# Patient Record
Sex: Female | Born: 1945 | Race: White | Hispanic: No | Marital: Single | Smoking: Never smoker
Health system: Southern US, Community
[De-identification: ages and names within clinical notes are randomized; demographics above are authoritative.]

## PROBLEM LIST (undated history)

## (undated) DIAGNOSIS — F329 Major depressive disorder, single episode, unspecified: Secondary | ICD-10-CM

## (undated) DIAGNOSIS — K219 Gastro-esophageal reflux disease without esophagitis: Secondary | ICD-10-CM

## (undated) DIAGNOSIS — F419 Anxiety disorder, unspecified: Secondary | ICD-10-CM

## (undated) DIAGNOSIS — F32A Depression, unspecified: Secondary | ICD-10-CM

## (undated) DIAGNOSIS — I10 Essential (primary) hypertension: Secondary | ICD-10-CM

## (undated) DIAGNOSIS — F0390 Unspecified dementia without behavioral disturbance: Secondary | ICD-10-CM

## (undated) HISTORY — PX: EYE SURGERY: SHX253

## (undated) HISTORY — DX: Depression, unspecified: F32.A

## (undated) HISTORY — PX: OTHER SURGICAL HISTORY: SHX169

## (undated) HISTORY — PX: CHOLECYSTECTOMY: SHX55

## (undated) HISTORY — DX: Essential (primary) hypertension: I10

## (undated) HISTORY — DX: Anxiety disorder, unspecified: F41.9

## (undated) HISTORY — DX: Major depressive disorder, single episode, unspecified: F32.9

## (undated) HISTORY — DX: Gastro-esophageal reflux disease without esophagitis: K21.9

---

## 2013-06-19 DIAGNOSIS — G5603 Carpal tunnel syndrome, bilateral upper limbs: Secondary | ICD-10-CM | POA: Insufficient documentation

## 2013-06-19 DIAGNOSIS — T753XXA Motion sickness, initial encounter: Secondary | ICD-10-CM | POA: Insufficient documentation

## 2013-06-19 DIAGNOSIS — Z720 Tobacco use: Secondary | ICD-10-CM | POA: Insufficient documentation

## 2013-06-19 DIAGNOSIS — E278 Other specified disorders of adrenal gland: Secondary | ICD-10-CM | POA: Insufficient documentation

## 2013-06-19 DIAGNOSIS — J449 Chronic obstructive pulmonary disease, unspecified: Secondary | ICD-10-CM | POA: Insufficient documentation

## 2013-06-19 DIAGNOSIS — M159 Polyosteoarthritis, unspecified: Secondary | ICD-10-CM | POA: Insufficient documentation

## 2016-04-06 DIAGNOSIS — E782 Mixed hyperlipidemia: Secondary | ICD-10-CM | POA: Insufficient documentation

## 2016-05-11 DIAGNOSIS — K219 Gastro-esophageal reflux disease without esophagitis: Secondary | ICD-10-CM | POA: Insufficient documentation

## 2016-06-19 DIAGNOSIS — E042 Nontoxic multinodular goiter: Secondary | ICD-10-CM | POA: Insufficient documentation

## 2016-06-19 DIAGNOSIS — E0590 Thyrotoxicosis, unspecified without thyrotoxic crisis or storm: Secondary | ICD-10-CM | POA: Insufficient documentation

## 2016-10-04 DIAGNOSIS — G4709 Other insomnia: Secondary | ICD-10-CM | POA: Insufficient documentation

## 2016-12-08 DIAGNOSIS — E876 Hypokalemia: Secondary | ICD-10-CM | POA: Insufficient documentation

## 2017-06-18 DIAGNOSIS — F418 Other specified anxiety disorders: Secondary | ICD-10-CM | POA: Insufficient documentation

## 2017-06-18 DIAGNOSIS — R413 Other amnesia: Secondary | ICD-10-CM | POA: Insufficient documentation

## 2017-06-18 DIAGNOSIS — G479 Sleep disorder, unspecified: Secondary | ICD-10-CM | POA: Insufficient documentation

## 2017-07-12 DIAGNOSIS — F0280 Dementia in other diseases classified elsewhere without behavioral disturbance: Secondary | ICD-10-CM | POA: Insufficient documentation

## 2017-09-17 DIAGNOSIS — F411 Generalized anxiety disorder: Secondary | ICD-10-CM | POA: Insufficient documentation

## 2017-11-29 DIAGNOSIS — I10 Essential (primary) hypertension: Secondary | ICD-10-CM | POA: Insufficient documentation

## 2018-01-29 NOTE — Progress Notes (Signed)
01/30/2018 11:12 AM   Erica Walker Apr 30, 1946 [id number]  Referring provider: Sharilyn Sites, MD [address] [facility] [place], [place] [address]  Chief Complaint  Patient presents with  . Urinary Frequency    HPI: Patient is a 72 -year-old Caucasian female who is referred to [place] by Dr. Sharyne Peach for urinary incontinence with her friend, [name].    Patient states that she has had urinary incontinence for months.  She states she does not keep track of all the time she travels to the bathroom.  She states that "everything she drinks just goes right through her."     Patient has incontinence with urgency.  She is wearing 3 pads during the day and 1 to 2 pads at night. Her incontinence volume is large.    She is having associated excessive urinary frequency and nocturia x 1-3.  Patient denies any gross hematuria, dysuria or suprapubic/flank pain.  Patient denies any fevers, chills, nausea or vomiting.  Her PVR is 42 mL.     She does not have a history of urinary tract infections, STI's or injury to the bladder.   She does not have a history of nephrolithiasis, GU surgery or GU trauma.   She is not sexually active.  She is post menopausal.   She denies constipation and/or diarrhea.   She is not having pain with bladder filling.    She has not had any recent imaging studies.    She is drinking 3 to 4 tall glasses of water daily.   She is drinking two caffeinated beverages daily (Cokes).  She is not drinking alcoholic beverages daily.  An occasional ginger ale.  An occasional tea.  No coffee.    PMH: Past Medical History:  Diagnosis Date  . Acid reflux   . Anxiety   . Depression   . Hypertension     Surgical History: Gall stones  Home Medications:  Allergies as of 01/30/2018   No Known Allergies     Medication List        Accurate as of 01/30/18 11:59 PM. Always use your most recent med list.          acetaminophen 500 MG tablet Commonly  known as:  TYLENOL Take 500 mg by mouth.   aspirin EC 81 MG tablet Take by mouth.   atenolol 25 MG tablet Commonly known as:  TENORMIN TAKE ONE TABLET BY MOUTH ONCE DAILY   citalopram 10 MG tablet Commonly known as:  CELEXA Take by mouth.   donepezil 10 MG tablet Commonly known as:  ARICEPT Take by mouth.   losartan 50 MG tablet Commonly known as:  COZAAR Take by mouth.   memantine 5 MG tablet Commonly known as:  NAMENDA Take by mouth.   multivitamin with minerals tablet Take by mouth.   QUEtiapine 100 MG tablet Commonly known as:  SEROQUEL Take by mouth.   simvastatin 20 MG tablet Commonly known as:  ZOCOR Take 20 mg by mouth.       Allergies: Allergies no known allergies  Family History: No family history on file.  Social History:  reports that  has never smoked. she has never used smokeless tobacco. She reports that she does not drink alcohol or use drugs.  ROS: UROLOGY Frequent Urination?: Yes Hard to postpone urination?: No Burning/pain with urination?: No Get up at night to urinate?: Yes Leakage of urine?: Yes Urine stream starts and stops?: No Trouble starting stream?: No Do you have  to strain to urinate?: No Blood in urine?: No Urinary tract infection?: No Sexually transmitted disease?: No Injury to kidneys or bladder?: No Painful intercourse?: No Weak stream?: No Currently pregnant?: No Vaginal bleeding?: No Last menstrual period?: n  Gastrointestinal Nausea?: No Vomiting?: No Indigestion/heartburn?: No Diarrhea?: No Constipation?: No  Constitutional Fever: No Night sweats?: No Weight loss?: No Fatigue?: No  Skin Skin rash/lesions?: No Itching?: No  Eyes Blurred vision?: No Double vision?: No  Ears/Nose/Throat Sore throat?: No Sinus problems?: Yes  Hematologic/Lymphatic Swollen glands?: No Easy bruising?: No  Cardiovascular Leg swelling?: No Chest pain?: No  Respiratory Cough?: No Shortness of breath?:  No  Endocrine Excessive thirst?: No  Musculoskeletal Back pain?: No Joint pain?: Yes  Neurological Headaches?: No Dizziness?: No  Psychologic Depression?: Yes Anxiety?: Yes  Physical Exam: BP (!) 158/72   Pulse 66   Ht 5\' 4"  (1.626 m)   Wt 147 lb (66.7 kg)   BMI 25.23 kg/m   Constitutional: Well nourished. Alert and oriented, No acute distress. HEENT: [place] AT, moist mucus membranes. Trachea midline, no masses. Cardiovascular: No clubbing, cyanosis, or edema. Respiratory: Normal respiratory effort, no increased work of breathing. GI: Abdomen is soft, non tender, non distended, no abdominal masses. Liver and spleen not palpable.  No hernias appreciated.  Stool sample for occult testing is not indicated.   GU: No CVA tenderness.  No bladder fullness or masses.  Atrophic external genitalia, normal pubic hair distribution, no lesions.  Normal urethral meatus, no lesions, no prolapse, no discharge.   No urethral masses, tenderness and/or tenderness. No bladder fullness, tenderness or masses. Pale vagina mucosa, poor estrogen effect, no discharge, no lesions, poor pelvic support, Grade II cystocele. No rectocele noted.  No cervical motion tenderness.  Uterus is freely mobile and non-fixed.  No adnexal/parametria masses or tenderness noted.  Anus and perineum are without rashes or lesions.    Skin: No rashes, bruises or suspicious lesions. Lymph: No cervical or inguinal adenopathy. Neurologic: Grossly intact, no focal deficits, moving all 4 extremities. Psychiatric: Normal mood and affect.  Laboratory Data: No results found for: WBC, HGB, HCT, MCV, PLT  No results found for: CREATININE  No results found for: PSA  No results found for: TESTOSTERONE  No results found for: HGBA1C  No results found for: TSH  No results found for: CHOL, HDL, CHOLHDL, VLDL, LDLCALC  No results found for: AST No results found for: ALT No components found for: ALKALINEPHOPHATASE No components  found for: BILIRUBINTOTAL  No results found for: ESTRADIOL  Urinalysis No results found for: COLORURINE, APPEARANCEUR, LABSPEC, PHURINE, GLUCOSEU, HGBUR, BILIRUBINUR, KETONESUR, PROTEINUR, UROBILINOGEN, NITRITE, LEUKOCYTESUR  I have reviewed the labs.   Pertinent Imaging: Results for Erica Walker, Erica Walker (MRN [id number]) as of 02/12/2018 11:02  Ref. Range 01/30/2018 11:13  Scan Result Unknown 42     Assessment & Plan:    1. Urge Incontinence  - offered behavioral therapies, bladder training, bladder control strategies and pelvic floor muscle training - not a candidate due to dementia  - fluid management - good water intake   - offered medical therapy with beta-3 adrenergic receptor agonist and the potential side effects   -- would like to try the beta-3 adrenergic receptor agonist (Myrbetriq).  Given Myrbetriq 25 mg samples, #28.  I have reviewed with the patient of the side effects of Myrbetriq, such as: elevation in BP, urinary retention and/or HA.    - RTC in 3 weeks for PVR and symptom recheck   2.  Nocturia  - I explained to the patient that nocturia is often multi-factorial and difficult to treat.  Sleeping disorders, heart conditions, peripheral vascular disease, diabetes, an enlarged prostate for men, an urethral stricture causing bladder outlet obstruction and/or certain medications can contribute to nocturia.  - I have suggested that the patient avoid caffeine after noon and alcohol in the evening.  He or she may also benefit from fluid restrictions after 6:00 in the evening and voiding just prior to bedtime.  - I have explained that research studies have showed that over 84% of patients with sleep apnea reported frequent nighttime urination.   With sleep apnea, oxygen decreases, carbon dioxide increases, the blood become more acidic, the heart rate drops and blood vessels in the lung constrict.  The body is then alerted that something is very wrong. The sleeper must wake enough to  reopen the airway. By this time, the heart is racing and experiences a false signal of fluid overload. The heart excretes a hormone-like protein that tells the body to get rid of sodium and water, resulting in nocturia.  -  I also informed the patient that a recent study noted that decreasing sodium intake to 2.3 grams daily, if they don't have issues with hyponatremia, can also reduce the number of nightly voids  - There is also an increased incidence in sleep apnea with menopause, symptoms include night sweats, daytime sleepiness, depressed mood, and cognitive complaints like poor concentration or problems with short-term memory   - The patient may benefit from a discussion with his or her primary care physician to see if he or she has risk factors for sleep apnea or other sleep disturbances and obtaining a sleep study - may not be a candidate due to dementia.  Return in about 3 weeks (around 02/20/2018) for PVR and OAB questionnaire.  These notes generated with voice recognition software. I apologize for typographical errors.  Michiel Cowboy, PA-C  [facility] [address] [place], [place] [address] [phone]

## 2018-01-30 ENCOUNTER — Ambulatory Visit (INDEPENDENT_AMBULATORY_CARE_PROVIDER_SITE_OTHER): Payer: Self-pay | Admitting: Urology

## 2018-01-30 ENCOUNTER — Encounter: Payer: Self-pay | Admitting: Urology

## 2018-01-30 VITALS — BP 158/72 | HR 66 | Ht 64.0 in | Wt 147.0 lb

## 2018-01-30 DIAGNOSIS — N3941 Urge incontinence: Secondary | ICD-10-CM

## 2018-01-30 DIAGNOSIS — R351 Nocturia: Secondary | ICD-10-CM

## 2018-01-30 LAB — BLADDER SCAN AMB NON-IMAGING
SCAN RESULT: 42
Scan Result: 231

## 2018-02-12 ENCOUNTER — Encounter: Payer: Self-pay | Admitting: Urology

## 2018-02-19 NOTE — Progress Notes (Addendum)
02/20/2018 11:57 AM   Erica Walker [birth year] [id number]  Referring provider: Sharilyn Sites, MD [address] [facility] [place], [place] [address]  Chief Complaint  Patient presents with  . Urinary Incontinence    HPI: 72 yo WF with urge incontinence and nocturia who presents for a three week follow up after a trial of Myrbetriq 25 mg daily.  Background history Patient is a 64 -year-old Caucasian female who is referred to [place] by Dr. Sharyne Peach for urinary incontinence with her friend, Erica Walker.  Patient states that she has had urinary incontinence for months.  She states she does not keep track of all the time she travels to the bathroom.  She states that "everything she drinks just goes right through her."   Patient has incontinence with urgency.  She is wearing 3 pads during the day and 1 to 2 pads at night. Her incontinence volume is large.   She is having associated excessive urinary frequency and nocturia x 1-3.  Patient denies any gross hematuria, dysuria or suprapubic/flank pain.  Patient denies any fevers, chills, nausea or vomiting.  Her PVR is 42 mL.   She does not have a history of urinary tract infections, STI's or injury to the bladder.   She does not have a history of nephrolithiasis, GU surgery or GU trauma.  She is not sexually active.  She is post menopausal.  She denies constipation and/or diarrhea.  She is not having pain with bladder filling.  She has not had any recent imaging studies.  She is drinking 3 to 4 tall glasses of water daily.   She is drinking two caffeinated beverages daily (Cokes).  She is not drinking alcoholic beverages daily.  An occasional ginger ale.  An occasional tea.  No coffee.    At her visit on 01/30/2018, she was started on Myrbetriq 25 mg daily.    Today, the patient has been experiencing urgency x 0-3, frequency x 4-7, not restricting fluids to avoid visits to the restroom,  is engaging in toilet mapping, incontinence x  0-3 and nocturia x 0-3.   Her PVR is 89 mL.  Her BP is 177/75.  Patient denies any gross hematuria, dysuria or suprapubic/flank pain.  Patient denies any fevers, chills, nausea or vomiting.  Myrbetriq was effective, but the patient may have doubled up on the medication.    PMH: Past Medical History:  Diagnosis Date  . Acid reflux   . Anxiety   . Depression   . Hypertension     Surgical History: Gall stones  Home Medications:  Allergies as of 02/20/2018   No Known Allergies     Medication List        Accurate as of 02/20/18 11:57 AM. Always use your most recent med list.          acetaminophen 500 MG tablet Commonly known as:  TYLENOL Take 500 mg by mouth.   aspirin EC 81 MG tablet Take by mouth.   atenolol 25 MG tablet Commonly known as:  TENORMIN TAKE ONE TABLET BY MOUTH ONCE DAILY   citalopram 10 MG tablet Commonly known as:  CELEXA Take by mouth.   donepezil 10 MG tablet Commonly known as:  ARICEPT Take by mouth.   losartan 50 MG tablet Commonly known as:  COZAAR Take by mouth.   memantine 5 MG tablet Commonly known as:  NAMENDA Take by mouth.   mirabegron ER 25 MG Tb24 tablet Commonly known as:  MYRBETRIQ  Take 1 tablet (25 mg total) by mouth daily.   multivitamin with minerals tablet Take by mouth.   QUEtiapine 100 MG tablet Commonly known as:  SEROQUEL Take by mouth.   simvastatin 20 MG tablet Commonly known as:  ZOCOR Take 20 mg by mouth.       Allergies: No Known Allergies  Family History: No family history on file.  Social History:  reports that she has never smoked. She has never used smokeless tobacco. She reports that she does not drink alcohol or use drugs.  ROS: UROLOGY Frequent Urination?: No Hard to postpone urination?: No Burning/pain with urination?: No Get up at night to urinate?: Yes Leakage of urine?: Yes Urine stream starts and stops?: No Trouble starting stream?: No Do you have to strain to urinate?: No Blood  in urine?: No Urinary tract infection?: No Sexually transmitted disease?: No Injury to kidneys or bladder?: No Painful intercourse?: No Weak stream?: No Currently pregnant?: No Vaginal bleeding?: No Last menstrual period?: n  Gastrointestinal Nausea?: No Vomiting?: No Indigestion/heartburn?: No Diarrhea?: No Constipation?: No  Constitutional Fever: No Night sweats?: No Weight loss?: No Fatigue?: No  Skin Skin rash/lesions?: No Itching?: No  Eyes Blurred vision?: No Double vision?: No  Ears/Nose/Throat Sore throat?: No Sinus problems?: No  Hematologic/Lymphatic Swollen glands?: No Easy bruising?: No  Cardiovascular Leg swelling?: No Chest pain?: No  Respiratory Cough?: No Shortness of breath?: No  Endocrine Excessive thirst?: No  Musculoskeletal Back pain?: No Joint pain?: No  Neurological Headaches?: No Dizziness?: No  Psychologic Depression?: No Anxiety?: Yes  Physical Exam: BP (!) 162/84   Pulse (!) 47   Ht 5\' 4"  (1.626 m)   Wt 150 lb 4.8 oz (68.2 kg)   BMI 25.80 kg/m   Constitutional: Well nourished. Alert and oriented, No acute distress. HEENT:  AT, moist mucus membranes. Trachea midline, no masses. Cardiovascular: No clubbing, cyanosis, or edema. Respiratory: Normal respiratory effort, no increased work of breathing. Skin: No rashes, bruises or suspicious lesions. Lymph: No cervical or inguinal adenopathy. Neurologic: Grossly intact, no focal deficits, moving all 4 extremities. Psychiatric: Normal mood and affect.    Laboratory Data: No results found for: WBC, HGB, HCT, MCV, PLT  No results found for: CREATININE  No results found for: PSA  No results found for: TESTOSTERONE  No results found for: HGBA1C  No results found for: TSH  No results found for: CHOL, HDL, CHOLHDL, VLDL, LDLCALC  No results found for: AST No results found for: ALT No components found for: ALKALINEPHOPHATASE No components found for:  BILIRUBINTOTAL  No results found for: ESTRADIOL  Urinalysis No results found for: COLORURINE, APPEARANCEUR, LABSPEC, PHURINE, GLUCOSEU, HGBUR, BILIRUBINUR, KETONESUR, PROTEINUR, UROBILINOGEN, NITRITE, LEUKOCYTESUR  I have reviewed the labs.   Pertinent Imaging: Results for Docia ChuckSNIPES, Madalina RILEY (MRN [id number]) as of 02/20/2018 11:55  Ref. Range 02/20/2018 11:00  Scan Result Unknown 89    Assessment & Plan:    1. Urge Incontinence  - offered behavioral therapies, bladder training, bladder control strategies and pelvic floor muscle training - not a candidate due to dementia  - fluid management - good water intake   - offered medical therapy with beta-3 adrenergic receptor agonist and the potential side effects   --continue the Myrbetriq 25 mg; script is given   - RTC in 3 months for PVR and symptom recheck   2. Nocturia  - improved with Myrbetriq  3. HTN Continue to monitor the blood pressure at home if remains elevated contact PCP  Red flags are reviewed     Return in about 3 months (around 05/23/2018) for PVR and OAB questionnaire.  These notes generated with voice recognition software. I apologize for typographical errors.  Michiel Cowboy, PA-C  [facility] [address] [place], [place] [address] [phone]

## 2018-02-20 ENCOUNTER — Encounter: Payer: Self-pay | Admitting: Urology

## 2018-02-20 ENCOUNTER — Other Ambulatory Visit: Payer: Self-pay

## 2018-02-20 ENCOUNTER — Ambulatory Visit (INDEPENDENT_AMBULATORY_CARE_PROVIDER_SITE_OTHER): Payer: Self-pay | Admitting: Urology

## 2018-02-20 VITALS — BP 162/84 | HR 47 | Ht 64.0 in | Wt 150.3 lb

## 2018-02-20 DIAGNOSIS — N3941 Urge incontinence: Secondary | ICD-10-CM

## 2018-02-20 DIAGNOSIS — R351 Nocturia: Secondary | ICD-10-CM

## 2018-02-20 LAB — BLADDER SCAN AMB NON-IMAGING: SCAN RESULT: 89

## 2018-02-20 MED ORDER — MIRABEGRON ER 25 MG PO TB24: 25.0000 mg | ORAL_TABLET | Freq: Every day | ORAL | 3 refills | Status: DC

## 2018-02-20 NOTE — Addendum Note (Signed)
Addended by: Michiel CowboyMCGOWAN, Katianna Mcclenney A on: 02/20/2018 12:03 PM   Modules accepted: Level of Service

## 2018-02-20 NOTE — Patient Instructions (Signed)
Mirabegron extended-release tablets What is this medicine? MIRABEGRON (MIR a BEG ron) is used to treat overactive bladder. This medicine reduces the amount of bathroom visits. It may also help to control wetting accidents. This medicine may be used for other purposes; ask your health care provider or pharmacist if you have questions. COMMON BRAND NAME(S): Myrbetriq What should I tell my health care provider before I take this medicine? They need to know if you have any of these conditions: -difficulty passing urine -high blood pressure -kidney disease -liver disease -an unusual or allergic reaction to mirabegron, other medicines, foods, dyes, or preservatives -pregnant or trying to get pregnant -breast-feeding How should I use this medicine? Take this medicine by mouth with a glass of water. Follow the directions on the prescription label. Do not cut, crush or chew this medicine. You can take it with or without food. If it upsets your stomach, take it with food. Take your medicine at regular intervals. Do not take it more often than directed. Do not stop taking except on your doctor's advice. Talk to your pediatrician regarding the use of this medicine in children. Special care may be needed. Overdosage: If you think you have taken too much of this medicine contact a poison control center or emergency room at once. NOTE: This medicine is only for you. Do not share this medicine with others. What if I miss a dose? If you miss a dose, take it as soon as you can. If it is almost time for your next dose, take only that dose. Do not take double or extra doses. What may interact with this medicine? -certain medicines for bladder problems like fesoterodine, oxybutynin, solifenacin, tolterodine -desipramine -digoxin -flecainide -ketoconazole -MAOIs like Carbex, Eldepryl, Marplan, Nardil, and Parnate -metoprolol -propafenone -thioridazine -warfarin This list may not describe all possible  interactions. Give your health care provider a list of all the medicines, herbs, non-prescription drugs, or dietary supplements you use. Also tell them if you smoke, drink alcohol, or use illegal drugs. Some items may interact with your medicine. What should I watch for while using this medicine? It may take 8 weeks to notice the full benefit from this medicine. You may need to limit your intake tea, coffee, caffeinated sodas, and alcohol. These drinks may make your symptoms worse. Visit your doctor or health care professional for regular checks on your progress. Check your blood pressure as directed. Ask your doctor or health care professional what your blood pressure should be and when you should contact him or her. What side effects may I notice from receiving this medicine? Side effects that you should report to your doctor or health care professional as soon as possible: -allergic reactions like skin rash, itching or hives, swelling of the face, lips, or tongue -chest pain or palpitations -severe or sudden headache -high blood pressure -fast, irregular heartbeat -redness, blistering, peeling or loosening of the skin, including inside the mouth -signs of infection like fever or chills; cough; sore throat; pain or difficulty passing urine -trouble passing urine or change in the amount of urine Side effects that usually do not require medical attention (report to your doctor or health care professional if they continue or are bothersome): -constipation -diarrhea -dizziness -dry eyes -joint pain -mild headache -nausea -runny nose This list may not describe all possible side effects. Call your doctor for medical advice about side effects. You may report side effects to FDA at 1-800-FDA-1088. Where should I keep my medicine? Keep out of the reach   of children. Store at room temperature between 15 and 30 degrees C (59 and 86 degrees F). Throw away any unused medicine after the expiration  date. NOTE: This sheet is a summary. It may not cover all possible information. If you have questions about this medicine, talk to your doctor, pharmacist, or health care provider.  2018 Elsevier/Gold Standard (2015-12-16 12:14:30)  

## 2018-04-15 DIAGNOSIS — B029 Zoster without complications: Secondary | ICD-10-CM | POA: Insufficient documentation

## 2018-05-17 ENCOUNTER — Other Ambulatory Visit: Payer: Self-pay | Admitting: Family Medicine

## 2018-05-17 DIAGNOSIS — Z1239 Encounter for other screening for malignant neoplasm of breast: Secondary | ICD-10-CM

## 2018-05-18 NOTE — Progress Notes (Signed)
05/20/2018 12:10 PM   Erica Walker Apr 03, 1946 [id number]  Referring provider: Sharilyn Sites, MD [address] [facility] [place], [place] [address]  Chief Complaint  Patient presents with  . Urinary Incontinence    HPI: 72 yo WF with urge incontinence and nocturia who presents for a three month follow up after a trial of Myrbetriq 25 mg daily.  Background history Patient is a 65 -year-old Caucasian female who is referred to [place] by Dr. Sharyne Peach for urinary incontinence with her friend, [name].  Patient states that she has had urinary incontinence for months.  She states she does not keep track of all the time she travels to the bathroom.  She states that "everything she drinks just goes right through her."   Patient has incontinence with urgency.  She is wearing 3 pads during the day and 1 to 2 pads at night. Her incontinence volume is large.   She is having associated excessive urinary frequency and nocturia x 1-3.  Patient denies any gross hematuria, dysuria or suprapubic/flank pain.  Patient denies any fevers, chills, nausea or vomiting.  Her PVR is 42 mL.   She does not have a history of urinary tract infections, STI's or injury to the bladder.   She does not have a history of nephrolithiasis, GU surgery or GU trauma.  She is not sexually active.  She is post menopausal.  She denies constipation and/or diarrhea.  She is not having pain with bladder filling.  She has not had any recent imaging studies.  She is drinking 3 to 4 tall glasses of water daily.   She is drinking two caffeinated beverages daily (Cokes).  She is not drinking alcoholic beverages daily.  An occasional ginger ale.  An occasional tea.  No coffee.    At her visit on 01/30/2018, she was started on Myrbetriq 25 mg daily.    Today, the patient has been experiencing urgency x 0-3 (stable), frequency x 4-7 (stable), not restricting fluids to avoid visits to the restroom,  is engaging in toilet  mapping, incontinence x 0-3 (stable) and nocturia x 0-3 (stable).   Her PVR is 119 mL.  Her BP is 125/70.  Patient denies any gross hematuria, dysuria or suprapubic/flank pain.  Patient denies any fevers, chills, nausea or vomiting.    She is still satisfied with the Myrbetriq 25 mg daily and would like to continue that medication.  PMH: Past Medical History:  Diagnosis Date  . Acid reflux   . Anxiety   . Depression   . Hypertension     Surgical History: Gall stones  Home Medications:  Allergies as of 05/20/2018      Reactions   Other Rash   Allergy to Bel-tab      Medication List        Accurate as of 05/20/18 12:10 PM. Always use your most recent med list.          acetaminophen 500 MG tablet Commonly known as:  TYLENOL Take 500 mg by mouth.   aspirin EC 81 MG tablet Take by mouth.   atenolol 25 MG tablet Commonly known as:  TENORMIN TAKE ONE TABLET BY MOUTH ONCE DAILY   citalopram 10 MG tablet Commonly known as:  CELEXA Take by mouth.   donepezil 10 MG tablet Commonly known as:  ARICEPT Take by mouth.   DRISTAN SPRAY 0.05 % nasal spray Generic drug:  oxymetazoline 1 spray by Each Nare route daily as needed.  fluticasone 50 MCG/ACT nasal spray Commonly known as:  FLONASE Two sprays in each nostril once daily.   losartan 50 MG tablet Commonly known as:  COZAAR Take by mouth.   memantine 5 MG tablet Commonly known as:  NAMENDA Take by mouth.   mirabegron ER 25 MG Tb24 tablet Commonly known as:  MYRBETRIQ Take 1 tablet (25 mg total) by mouth daily.   multivitamin with minerals tablet Take by mouth.   QUEtiapine 100 MG tablet Commonly known as:  SEROQUEL Take by mouth.   simvastatin 20 MG tablet Commonly known as:  ZOCOR Take 20 mg by mouth.   valACYclovir 1000 MG tablet Commonly known as:  VALTREX       Allergies:  Allergies  Allergen Reactions  . Other Rash    Allergy to Bel-tab    Family History: History reviewed. No  pertinent family history.  Social History:  reports that she has never smoked. She has never used smokeless tobacco. She reports that she does not drink alcohol or use drugs.  ROS: UROLOGY Frequent Urination?: Yes Hard to postpone urination?: No Burning/pain with urination?: No Get up at night to urinate?: Yes Leakage of urine?: Yes Urine stream starts and stops?: No Trouble starting stream?: No Do you have to strain to urinate?: No Blood in urine?: No Urinary tract infection?: No Sexually transmitted disease?: No Injury to kidneys or bladder?: No Painful intercourse?: No Weak stream?: No Currently pregnant?: No Vaginal bleeding?: No Last menstrual period?: Postmenopausal  Gastrointestinal Nausea?: No Vomiting?: No Indigestion/heartburn?: No Diarrhea?: No Constipation?: No  Constitutional Fever: No Night sweats?: No Weight loss?: No Fatigue?: No  Skin Skin rash/lesions?: No Itching?: No  Eyes Blurred vision?: No Double vision?: No  Ears/Nose/Throat Sore throat?: No Sinus problems?: No  Hematologic/Lymphatic Easy bruising?: No  Cardiovascular Leg swelling?: No Chest pain?: No  Respiratory Cough?: No Shortness of breath?: No  Endocrine Excessive thirst?: No  Musculoskeletal Back pain?: No Joint pain?: No  Neurological Headaches?: No Dizziness?: No  Psychologic Depression?: Yes Anxiety?: Yes  Physical Exam: BP 125/70 (BP Location: Left Arm, Patient Position: Sitting, Cuff Size: Large)   Pulse 77   Ht 5\' 4"  (1.626 m)   Wt 148 lb 11.2 oz (67.4 kg)   BMI 25.52 kg/m   Constitutional: Well nourished. Alert and oriented, No acute distress. HEENT: [place] AT, moist mucus membranes. Trachea midline, no masses. Cardiovascular: No clubbing, cyanosis, or edema. Respiratory: Normal respiratory effort, no increased work of breathing. Skin: No rashes, bruises or suspicious lesions. Lymph: No cervical or inguinal adenopathy. Neurologic: Grossly  intact, no focal deficits, moving all 4 extremities. Psychiatric: Normal mood and affect.   Laboratory Data: No results found for: WBC, HGB, HCT, MCV, PLT  No results found for: CREATININE  No results found for: PSA  No results found for: TESTOSTERONE  No results found for: HGBA1C  No results found for: TSH  No results found for: CHOL, HDL, CHOLHDL, VLDL, LDLCALC  No results found for: AST No results found for: ALT No components found for: ALKALINEPHOPHATASE No components found for: BILIRUBINTOTAL  No results found for: ESTRADIOL  Urinalysis No results found for: COLORURINE, APPEARANCEUR, LABSPEC, PHURINE, GLUCOSEU, HGBUR, BILIRUBINUR, KETONESUR, PROTEINUR, UROBILINOGEN, NITRITE, LEUKOCYTESUR  I have reviewed the labs.   Pertinent Imaging: Results for Sudie BaileySNIPES, Shiva K RILEY (MRN [id number]) as of 05/20/2018 20:44  Ref. Range 05/20/2018 11:41  Scan Result Unknown 119mL     Assessment & Plan:    1. Urge Incontinence At goal with  Myrbetriq 25 mg daily Patient return in 1 year for OAB questionnaire and PVR  2. Nocturia Improved with Myrbetriq     Return in about 1 year (around 05/21/2019) for PVR and OAB questionnaire.  These notes generated with voice recognition software. I apologize for typographical errors.  Michiel Cowboy, PA-C  [facility] [address] [place], [place] [address] [phone]

## 2018-05-20 ENCOUNTER — Encounter: Payer: Self-pay | Admitting: Urology

## 2018-05-20 ENCOUNTER — Ambulatory Visit (INDEPENDENT_AMBULATORY_CARE_PROVIDER_SITE_OTHER): Payer: Medicare Other | Admitting: Urology

## 2018-05-20 VITALS — BP 125/70 | HR 77 | Ht 64.0 in | Wt 148.7 lb

## 2018-05-20 DIAGNOSIS — N3941 Urge incontinence: Secondary | ICD-10-CM | POA: Diagnosis not present

## 2018-05-20 DIAGNOSIS — R351 Nocturia: Secondary | ICD-10-CM

## 2018-05-20 LAB — BLADDER SCAN AMB NON-IMAGING

## 2019-01-15 ENCOUNTER — Other Ambulatory Visit: Payer: Self-pay | Admitting: Urology

## 2019-05-20 ENCOUNTER — Telehealth (INDEPENDENT_AMBULATORY_CARE_PROVIDER_SITE_OTHER): Payer: Medicare Other | Admitting: Urology

## 2019-05-20 ENCOUNTER — Other Ambulatory Visit: Payer: Self-pay

## 2019-05-20 DIAGNOSIS — N3941 Urge incontinence: Secondary | ICD-10-CM | POA: Diagnosis not present

## 2019-05-20 NOTE — Progress Notes (Signed)
Virtual Visit via Telephone Note  I connected with Erica Walker on 05/20/2019 at 1112 by telephone and verified that I am speaking with the correct person using two identifiers.  They are located at home.  I am located at my home.    This visit type was conducted due to national recommendations for restrictions regarding the COVID-19 Pandemic (e.g. social distancing).  This format is felt to be most appropriate for this patient at this time.  All issues noted in this document were discussed and addressed.  No physical exam was performed.   I discussed the limitations, risks, security and privacy concerns of performing an evaluation and management service by telephone and the availability of in person appointments. I also discussed with the patient that there may be a patient responsible charge related to this service. The patient expressed understanding and agreed to proceed.   History of Present Illness: Erica Walker is a 73 year old female with a history of urge incontinence who is contacted today for a yearly follow up via telephone.  She states she is no longer taking the Myrbetriq and she is no longer having issues with incontinence.  She cannot give a time for when she discontinued the Myrbetriq or for what reason.  She also cannot tell me when her incontinence resolved.    She was seen by her PCP recently and in his notes, her friend with her at the visit stated she started having "sweats" when she started the Myrbetriq.  Erica Walker did not recall this discussion.    Patient denies any gross hematuria, dysuria or suprapubic/flank pain.  Patient denies any fevers, chills, nausea or vomiting.    Observations/Objective: Erica Walker does not sound distressed, but she is forgetful about her visit with her PCP.    Assessment and Plan:  1. Urge incontinence Patient is no longer having urinary issues at this time.    Follow Up Instructions:  Erica Walker will follow up on a prn  basis.   I discussed the assessment and treatment plan with the patient. The patient was provided an opportunity to ask questions and all were answered. The patient agreed with the plan and demonstrated an understanding of the instructions.   The patient was advised to call back or seek an in-person evaluation if the symptoms worsen or if the condition fails to improve as anticipated.  I provided 6 minutes of non-face-to-face time during this encounter.   Erica Spainhower, PA-C

## 2019-09-24 ENCOUNTER — Other Ambulatory Visit: Payer: Self-pay

## 2019-09-24 ENCOUNTER — Inpatient Hospital Stay
Admission: EM | Admit: 2019-09-24 | Discharge: 2019-09-29 | DRG: 522 | Disposition: A | Discharge status: Skilled Nursing Facility | Payer: Medicare Other | Attending: Internal Medicine | Admitting: Internal Medicine

## 2019-09-24 ENCOUNTER — Emergency Department: Payer: Medicare Other

## 2019-09-24 DIAGNOSIS — S79912A Unspecified injury of left hip, initial encounter: Secondary | ICD-10-CM | POA: Diagnosis present

## 2019-09-24 DIAGNOSIS — Z20828 Contact with and (suspected) exposure to other viral communicable diseases: Secondary | ICD-10-CM | POA: Diagnosis present

## 2019-09-24 DIAGNOSIS — F418 Other specified anxiety disorders: Secondary | ICD-10-CM | POA: Diagnosis present

## 2019-09-24 DIAGNOSIS — E785 Hyperlipidemia, unspecified: Secondary | ICD-10-CM | POA: Diagnosis present

## 2019-09-24 DIAGNOSIS — Z79899 Other long term (current) drug therapy: Secondary | ICD-10-CM

## 2019-09-24 DIAGNOSIS — W010XXA Fall on same level from slipping, tripping and stumbling without subsequent striking against object, initial encounter: Secondary | ICD-10-CM | POA: Diagnosis present

## 2019-09-24 DIAGNOSIS — Z8249 Family history of ischemic heart disease and other diseases of the circulatory system: Secondary | ICD-10-CM

## 2019-09-24 DIAGNOSIS — F05 Delirium due to known physiological condition: Secondary | ICD-10-CM | POA: Diagnosis not present

## 2019-09-24 DIAGNOSIS — E876 Hypokalemia: Secondary | ICD-10-CM | POA: Diagnosis present

## 2019-09-24 DIAGNOSIS — Z888 Allergy status to other drugs, medicaments and biological substances status: Secondary | ICD-10-CM

## 2019-09-24 DIAGNOSIS — L89152 Pressure ulcer of sacral region, stage 2: Secondary | ICD-10-CM | POA: Diagnosis present

## 2019-09-24 DIAGNOSIS — S72009A Fracture of unspecified part of neck of unspecified femur, initial encounter for closed fracture: Secondary | ICD-10-CM | POA: Diagnosis present

## 2019-09-24 DIAGNOSIS — I10 Essential (primary) hypertension: Secondary | ICD-10-CM | POA: Diagnosis present

## 2019-09-24 DIAGNOSIS — F0390 Unspecified dementia without behavioral disturbance: Secondary | ICD-10-CM | POA: Diagnosis present

## 2019-09-24 DIAGNOSIS — J449 Chronic obstructive pulmonary disease, unspecified: Secondary | ICD-10-CM | POA: Diagnosis present

## 2019-09-24 DIAGNOSIS — K219 Gastro-esophageal reflux disease without esophagitis: Secondary | ICD-10-CM | POA: Diagnosis present

## 2019-09-24 DIAGNOSIS — W19XXXA Unspecified fall, initial encounter: Secondary | ICD-10-CM | POA: Diagnosis not present

## 2019-09-24 DIAGNOSIS — Z7982 Long term (current) use of aspirin: Secondary | ICD-10-CM

## 2019-09-24 DIAGNOSIS — E782 Mixed hyperlipidemia: Secondary | ICD-10-CM | POA: Diagnosis present

## 2019-09-24 DIAGNOSIS — S72002A Fracture of unspecified part of neck of left femur, initial encounter for closed fracture: Secondary | ICD-10-CM | POA: Diagnosis not present

## 2019-09-24 DIAGNOSIS — Y92019 Unspecified place in single-family (private) house as the place of occurrence of the external cause: Secondary | ICD-10-CM

## 2019-09-24 DIAGNOSIS — S72012A Unspecified intracapsular fracture of left femur, initial encounter for closed fracture: Secondary | ICD-10-CM | POA: Diagnosis present

## 2019-09-24 DIAGNOSIS — L8990 Pressure ulcer of unspecified site, unspecified stage: Secondary | ICD-10-CM | POA: Insufficient documentation

## 2019-09-24 DIAGNOSIS — Z419 Encounter for procedure for purposes other than remedying health state, unspecified: Secondary | ICD-10-CM

## 2019-09-24 HISTORY — DX: Unspecified dementia, unspecified severity, without behavioral disturbance, psychotic disturbance, mood disturbance, and anxiety: F03.90

## 2019-09-24 LAB — CBC WITH DIFFERENTIAL/PLATELET
Abs Immature Granulocytes: 0.04 10*3/uL (ref 0.00–0.07)
Basophils Absolute: 0 10*3/uL (ref 0.0–0.1)
Basophils Relative: 0 %
Eosinophils Absolute: 0 10*3/uL (ref 0.0–0.5)
Eosinophils Relative: 1 %
HCT: 40.4 % (ref 36.0–46.0)
Hemoglobin: 13.5 g/dL (ref 12.0–15.0)
Immature Granulocytes: 1 %
Lymphocytes Relative: 9 %
Lymphs Abs: 0.8 10*3/uL (ref 0.7–4.0)
MCH: 30.2 pg (ref 26.0–34.0)
MCHC: 33.4 g/dL (ref 30.0–36.0)
MCV: 90.4 fL (ref 80.0–100.0)
Monocytes Absolute: 0.6 10*3/uL (ref 0.1–1.0)
Monocytes Relative: 7 %
Neutro Abs: 6.9 10*3/uL (ref 1.7–7.7)
Neutrophils Relative %: 82 %
Platelets: 154 10*3/uL (ref 150–400)
RBC: 4.47 MIL/uL (ref 3.87–5.11)
RDW: 12.2 % (ref 11.5–15.5)
WBC: 8.4 10*3/uL (ref 4.0–10.5)
nRBC: 0 % (ref 0.0–0.2)

## 2019-09-24 LAB — TROPONIN I (HIGH SENSITIVITY)
Troponin I (High Sensitivity): 13 ng/L (ref <18)
Troponin I (High Sensitivity): 23 ng/L — ABNORMAL HIGH (ref <18)

## 2019-09-24 LAB — COMPREHENSIVE METABOLIC PANEL
ALT: 21 U/L (ref 0–44)
AST: 26 U/L (ref 15–41)
Albumin: 3.6 g/dL (ref 3.5–5.0)
Alkaline Phosphatase: 74 U/L (ref 38–126)
Anion gap: 12 (ref 5–15)
BUN: 8 mg/dL (ref 8–23)
CO2: 26 mmol/L (ref 22–32)
Calcium: 8.6 mg/dL — ABNORMAL LOW (ref 8.9–10.3)
Chloride: 101 mmol/L (ref 98–111)
Creatinine, Ser: 0.84 mg/dL (ref 0.44–1.00)
GFR calc Af Amer: >60 mL/min (ref >60)
GFR calc non Af Amer: >60 mL/min (ref >60)
Glucose, Bld: 113 mg/dL — ABNORMAL HIGH (ref 70–99)
Potassium: 2.8 mmol/L — ABNORMAL LOW (ref 3.5–5.1)
Sodium: 139 mmol/L (ref 135–145)
Total Bilirubin: 1.8 mg/dL — ABNORMAL HIGH (ref 0.3–1.2)
Total Protein: 6.4 g/dL — ABNORMAL LOW (ref 6.5–8.1)

## 2019-09-24 LAB — SAMPLE TO BLOOD BANK

## 2019-09-24 LAB — SARS CORONAVIRUS 2 BY RT PCR (HOSPITAL ORDER, PERFORMED IN ~~LOC~~ HOSPITAL LAB): SARS Coronavirus 2: NEGATIVE

## 2019-09-24 LAB — MAGNESIUM: Magnesium: 1.8 mg/dL (ref 1.7–2.4)

## 2019-09-24 MED ORDER — ACETAMINOPHEN 325 MG PO TABS
650.0000 mg | ORAL_TABLET | Freq: Four times a day (QID) | ORAL | Status: DC | PRN
  Administered 2019-09-24 – 2019-09-29 (×3): 650 mg via ORAL
  Filled 2019-09-24 (×3): qty 2

## 2019-09-24 MED ORDER — ADULT MULTIVITAMIN W/MINERALS CH
1.0000 | ORAL_TABLET | Freq: Every day | ORAL | Status: DC
  Administered 2019-09-26 – 2019-09-29 (×4): 1 via ORAL
  Filled 2019-09-24 (×4): qty 1

## 2019-09-24 MED ORDER — CEFAZOLIN SODIUM-DEXTROSE 2-4 GM/100ML-% IV SOLN
2.0000 g | INTRAVENOUS | Status: AC
  Administered 2019-09-25: 2 g via INTRAVENOUS
  Filled 2019-09-24 (×2): qty 100

## 2019-09-24 MED ORDER — ATENOLOL 25 MG PO TABS: 25.0000 mg | ORAL_TABLET | Freq: Every day | ORAL | Status: DC

## 2019-09-24 MED ORDER — ONDANSETRON HCL 4 MG PO TABS
4.0000 mg | ORAL_TABLET | Freq: Four times a day (QID) | ORAL | Status: DC | PRN
  Filled 2019-09-24: qty 1

## 2019-09-24 MED ORDER — MORPHINE SULFATE (PF) 2 MG/ML IV SOLN
1.0000 mg | INTRAVENOUS | Status: DC | PRN
  Administered 2019-09-25 (×2): 1 mg via INTRAVENOUS
  Filled 2019-09-24 (×2): qty 1

## 2019-09-24 MED ORDER — LOSARTAN POTASSIUM 50 MG PO TABS
50.0000 mg | ORAL_TABLET | Freq: Every day | ORAL | Status: DC
  Administered 2019-09-26 – 2019-09-29 (×4): 50 mg via ORAL
  Filled 2019-09-24 (×4): qty 1

## 2019-09-24 MED ORDER — POTASSIUM CHLORIDE CRYS ER 20 MEQ PO TBCR
40.0000 meq | EXTENDED_RELEASE_TABLET | Freq: Two times a day (BID) | ORAL | Status: AC
  Administered 2019-09-24 – 2019-09-25 (×2): 40 meq via ORAL
  Filled 2019-09-24 (×2): qty 2

## 2019-09-24 MED ORDER — OXYMETAZOLINE HCL 0.05 % NA SOLN
1.0000 | Freq: Two times a day (BID) | NASAL | Status: DC
  Administered 2019-09-24: 1 via NASAL
  Filled 2019-09-24: qty 15

## 2019-09-24 MED ORDER — FENTANYL CITRATE (PF) 100 MCG/2ML IJ SOLN
25.0000 ug | Freq: Once | INTRAMUSCULAR | Status: AC
  Administered 2019-09-24: 14:00:00 25 ug via INTRAVENOUS
  Filled 2019-09-24: qty 2

## 2019-09-24 MED ORDER — OXYCODONE HCL 5 MG PO TABS
5.0000 mg | ORAL_TABLET | ORAL | Status: DC | PRN
  Administered 2019-09-25 – 2019-09-26 (×3): 5 mg via ORAL
  Filled 2019-09-24 (×3): qty 1

## 2019-09-24 MED ORDER — FLUTICASONE PROPIONATE 50 MCG/ACT NA SUSP
1.0000 | Freq: Every day | NASAL | Status: DC
  Administered 2019-09-29: 1 via NASAL
  Filled 2019-09-24 (×2): qty 16

## 2019-09-24 MED ORDER — SODIUM CHLORIDE 0.9 % IV SOLN
INTRAVENOUS | Status: DC
  Administered 2019-09-24: 18:00:00 via INTRAVENOUS

## 2019-09-24 MED ORDER — MAGNESIUM SULFATE IN D5W 1-5 GM/100ML-% IV SOLN
1.0000 g | Freq: Once | INTRAVENOUS | Status: AC
  Administered 2019-09-24: 22:00:00 1 g via INTRAVENOUS
  Filled 2019-09-24: qty 100

## 2019-09-24 MED ORDER — SIMVASTATIN 20 MG PO TABS
20.0000 mg | ORAL_TABLET | Freq: Every day | ORAL | Status: DC
  Administered 2019-09-24 – 2019-09-28 (×4): 20 mg via ORAL
  Filled 2019-09-24 (×4): qty 1

## 2019-09-24 MED ORDER — ACETAMINOPHEN 650 MG RE SUPP: 650.0000 mg | Freq: Four times a day (QID) | RECTAL | Status: DC | PRN

## 2019-09-24 MED ORDER — SODIUM CHLORIDE 0.9 % IV SOLN: Freq: Once | INTRAVENOUS | Status: DC

## 2019-09-24 MED ORDER — DONEPEZIL HCL 5 MG PO TABS
10.0000 mg | ORAL_TABLET | Freq: Every day | ORAL | Status: DC
  Administered 2019-09-24 – 2019-09-28 (×5): 10 mg via ORAL
  Filled 2019-09-24 (×5): qty 2

## 2019-09-24 MED ORDER — POTASSIUM CHLORIDE CRYS ER 20 MEQ PO TBCR
40.0000 meq | EXTENDED_RELEASE_TABLET | Freq: Once | ORAL | Status: AC
  Administered 2019-09-24: 15:00:00 40 meq via ORAL
  Filled 2019-09-24: qty 2

## 2019-09-24 MED ORDER — POTASSIUM CHLORIDE 10 MEQ/100ML IV SOLN
10.0000 meq | INTRAVENOUS | Status: AC
  Administered 2019-09-24 (×2): 10 meq via INTRAVENOUS
  Filled 2019-09-24 (×2): qty 100

## 2019-09-24 MED ORDER — BUPROPION HCL ER (XL) 150 MG PO TB24
150.0000 mg | ORAL_TABLET | Freq: Every day | ORAL | Status: DC
  Administered 2019-09-26 – 2019-09-29 (×4): 150 mg via ORAL
  Filled 2019-09-24 (×4): qty 1

## 2019-09-24 MED ORDER — ONDANSETRON HCL 4 MG/2ML IJ SOLN
4.0000 mg | Freq: Four times a day (QID) | INTRAMUSCULAR | Status: DC | PRN
  Filled 2019-09-24: qty 2

## 2019-09-24 MED ORDER — MEMANTINE HCL 5 MG PO TABS
10.0000 mg | ORAL_TABLET | Freq: Two times a day (BID) | ORAL | Status: DC
  Administered 2019-09-24 – 2019-09-29 (×9): 10 mg via ORAL
  Filled 2019-09-24 (×9): qty 2

## 2019-09-24 MED ORDER — TRANEXAMIC ACID-NACL 1000-0.7 MG/100ML-% IV SOLN
1000.0000 mg | INTRAVENOUS | Status: AC
  Administered 2019-09-25: 1000 mg via INTRAVENOUS
  Filled 2019-09-24: qty 100

## 2019-09-24 MED ORDER — QUETIAPINE FUMARATE 25 MG PO TABS
50.0000 mg | ORAL_TABLET | Freq: Every day | ORAL | Status: DC
  Administered 2019-09-24: 21:00:00 50 mg via ORAL
  Filled 2019-09-24: qty 2

## 2019-09-24 NOTE — ED Notes (Signed)
ED TO INPATIENT HANDOFF REPORT  ED Nurse Name and Phone #: [phone]  S Name/Age/Gender Erica Walker 73 y.o. female Room/Bed: ED11A/ED11A  Code Status   Code Status: Not on file  Home/SNF/Other Home A/Ox4 Is this baseline? Yes   Triage Complete: Triage complete  Chief Complaint fall  [facility]. [facility] right [facility]/femur area and having trouble walking today.  She says she did not feel bad prior to fall--says she just went down.  Did not trip.  Mechanical fell yesterday, pain to left hip and leg since. Able to walk but with pain. Pt alert and oriented X4, cooperative, RR even and unlabored, color WNL. Pt in NAD.    Allergies Allergies  Allergen Reactions  . Other Rash    Allergy to Bel-tab    Level of Care/Admitting Diagnosis ED Disposition    ED Disposition Condition Comment   Admit  The patient appears reasonably stabilized for admission considering the current resources, flow, and capabilities available in the ED at this time, and I doubt any other [facility] requiring further screening and/or treatment in the ED prior to admission is  present.       B Medical/Surgery History Past Medical History:  Diagnosis Date  . Acid reflux   . Anxiety   . Depression   . Hypertension    Past Surgical History:  Procedure Laterality Date  . CHOLECYSTECTOMY    . EYE SURGERY    . gallstones       A IV Location/Drains/Wounds Patient Lines/Drains/Airways Status   Active Line/Drains/Airways    Name:   Placement date:   Placement time:   Site:   Days:   Peripheral IV 09/24/19 Left Hand   09/24/19    1302    Hand   less than 1   External Urinary Catheter   09/24/19    1442    -   less than 1          Intake/Output Last 24 hours No intake or output data in the 24 hours ending 09/24/19 1525  Labs/Imaging Results for orders placed or performed during the hospital encounter of 09/24/19 (from the past 48 hour(s))  CBC with Differential     Status: None   Collection Time: 09/24/19 12:56 PM  Result Value Ref Range   WBC 8.4 4.0 - 10.5 K/uL   RBC 4.47 3.87 - 5.11 MIL/uL   Hemoglobin 13.5 12.0 - 15.0 g/dL   HCT 16.140.4 09.636.0 - 04.546.0 %   MCV 90.4 80.0 - 100.0 fL   MCH 30.2 26.0 - 34.0 pg   MCHC 33.4 30.0 - 36.0 g/dL   RDW 40.912.2 81.111.5 - 91.415.5 %   Platelets 154 150 - 400 K/uL   nRBC 0.0 0.0 - 0.2 %   Neutrophils Relative % 82 %   Neutro Abs 6.9 1.7 - 7.7 K/uL   Lymphocytes Relative 9 %   Lymphs Abs 0.8 0.7 - 4.0 K/uL   Monocytes Relative 7 %   Monocytes Absolute 0.6 0.1 - 1.0 K/uL   Eosinophils Relative 1 %   Eosinophils Absolute 0.0 0.0 - 0.5 K/uL   Basophils Relative 0 %   Basophils Absolute 0.0 0.0 - 0.1 K/uL   Immature Granulocytes 1 %   Abs Immature Granulocytes 0.04 0.00 - 0.07 K/uL    Comment: Performed at [facility], [address] [place], [place] [address]  Comprehensive metabolic panel     Status: Abnormal   Collection Time: 09/24/19 12:56 PM  Result Value Ref Range   Sodium 139 135 - 145 mmol/L   Potassium 2.8 (L) 3.5 - 5.1 mmol/L   Chloride 101 98 - 111 mmol/L   CO2 26 22 - 32 mmol/L   Glucose, Bld 113 (H) 70 - 99 mg/dL   BUN 8 8 - 23 mg/dL   Creatinine, Ser 0.84 0.44 - 1.00 mg/dL   Calcium 8.6 (L) 8.9 - 10.3 mg/dL   Total Protein 6.4 (L) 6.5 - 8.1 g/dL   Albumin 3.6 3.5 - 5.0 g/dL   AST 26 15 - 41 U/L   ALT 21 0 - 44 U/L   Alkaline Phosphatase 74 38 - 126 U/L   Total Bilirubin 1.8 (H) 0.3 - 1.2 mg/dL   GFR calc non Af Amer >60 >60 mL/min   GFR calc Af Amer >60 >60 mL/min   Anion gap 12 5 - 15    Comment: Performed at [facility], [place], [place] [address]  Troponin I (High Sensitivity)     Status: None   Collection Time: 09/24/19 12:56 PM  Result Value Ref Range   Troponin I (High Sensitivity) 13 <18 ng/L    Comment: (NOTE) Elevated high sensitivity troponin I (hsTnI) values and significant  changes across serial measurements may suggest ACS but many other  chronic and acute  conditions are known to elevate hsTnI results.  Refer to the "Links" section for chest pain algorithms and additional  guidance. Performed at [facility], [address] [place], [place] [address]   Sample to Blood Bank     Status: None   Collection Time: 09/24/19 12:58 PM  Result Value Ref Range   Blood Bank Specimen      HEMOLYZED NOTIFIED TERESA CLAPP 09/24/2019 AT 0130 HS   Sample Expiration      09/27/2019,2359 Performed at [facility], [address] [place], [place] [address]    Dg Chest 1 View  Result Date: 09/24/2019 CLINICAL DATA:  Fall yesterday on left hip EXAM: CHEST  1 VIEW COMPARISON:  None. FINDINGS: Normal heart size. Normal mediastinal contour. No pneumothorax. No pleural effusion. Lungs appear clear, with no acute consolidative airspace disease and no pulmonary edema. IMPRESSION: No active disease. Electronically Signed   By: Ilona Sorrel M.D.   On: 09/24/2019 12:33   Ct Head Wo Contrast  Result Date: 09/24/2019 CLINICAL DATA:  Status post fall yesterday.  Initial encounter. EXAM: CT HEAD WITHOUT CONTRAST TECHNIQUE: Contiguous axial images were obtained from the base of the skull through the vertex without intravenous contrast. COMPARISON:  None. FINDINGS: Brain: No evidence of acute infarction, hemorrhage, hydrocephalus, extra-axial collection or mass lesion/mass effect. Atrophy and chronic microvascular ischemic change noted. Vascular: No hyperdense vessel or unexpected calcification. Skull: Intact.  No focal lesion. Sinuses/Orbits: Negative. Other: None. IMPRESSION: No acute abnormality. Atrophy and chronic microvascular ischemic change. Electronically Signed   By: Inge Rise M.D.   On: 09/24/2019 14:02   Dg Hip Unilat With Pelvis 2-3 Views Left  Result Date: 09/24/2019 CLINICAL DATA:  Left hip pain secondary to a fall yesterday. EXAM: DG HIP (WITH OR WITHOUT PELVIS) 2-3V LEFT COMPARISON:  None. FINDINGS: There is an impacted subcapital  fracture left femoral neck with slight angulation. No dislocation. The pelvic bones are intact. IMPRESSION: Impacted subcapital fracture of the left femoral neck with slight angulation. Electronically Signed   By: Lorriane Shire M.D.   On: 09/24/2019 12:32    Pending Labs Unresulted Labs (From admission,  onward)   None      Vitals/Pain Today's Vitals   09/24/19 1143 09/24/19 1306 09/24/19 1445 09/24/19 1449  BP: (!) 148/69     Pulse: 70  (!) 57   Resp: 18  16   Temp: 98.9 F (37.2 C)     TempSrc: Oral     SpO2: 100%  97%   Weight: 72.6 kg     Height: 5\' 4"  (1.626 m)     PainSc: 2  5   2      Isolation Precautions No active isolations  Medications Medications  0.9 %  sodium chloride infusion (has no administration in time range)  fentaNYL (SUBLIMAZE) injection 25 mcg (25 mcg Intravenous Given 09/24/19 1412)  potassium chloride SA (KLOR-CON) CR tablet 40 mEq (40 mEq Oral Given 09/24/19 1446)    Mobility walks Low fall risk     R Recommendations: See Admitting Provider Note  Report given to:   Additional Notes: family at bedside

## 2019-09-24 NOTE — Plan of Care (Signed)
  Problem: Education: Goal: Knowledge of General Education information will improve Description: Including pain rating scale, medication(s)/side effects and non-pharmacologic comfort measures Outcome: Progressing   Problem: Health Behavior/Discharge Planning: Goal: Ability to manage health-related needs will improve Outcome: Progressing   Problem: Clinical Measurements: Goal: Respiratory complications will improve Outcome: Progressing Goal: Cardiovascular complication will be avoided Outcome: Progressing   Problem: Nutrition: Goal: Adequate nutrition will be maintained Outcome: Progressing   

## 2019-09-24 NOTE — ED Triage Notes (Signed)
Fell yesterday. Pain right hip/femur area and having trouble walking today.  She says she did not feel bad prior to fall--says she just went down.  Did not trip.

## 2019-09-24 NOTE — ED Notes (Signed)
Blood bank called and sample has hemolyzed - will need to recollect

## 2019-09-24 NOTE — ED Provider Notes (Signed)
[facility] Emergency Department Provider Note  ____________________________________________   First MD Initiated Contact with Patient 09/24/19 1233     (approximate)  I have reviewed the triage vital signs and the nursing notes.   HISTORY  Chief Complaint Fall    HPI Erica Walker is a 73 y.o. female  With h/o HTN, depression, COPD, here with   fall.  The patient states that she was walking in her neighbor's house, when she believes that she slipped, causing her to fall.  She landed primarily on her left hip.  She states that she had immediate onset of mild pain of her hip, though it was only minimal when she was not moving.  She does not believe she hit her head.  She actually fell yesterday, and was helped to her house in bed, where she thought that her pain was slightly improved.  Overnight, she began to have progressive worsening pain.  She could not walk today.  She subsequent presents for evaluation.  Denies any other areas of pain or tenderness.  No recent fevers chills.  No recent medication changes.  She does have history of mild dementia, and lives alone.  No blood thinner use.  No headache.       Past Medical History:  Diagnosis Date  . Acid reflux   . Anxiety   . Depression   . Hypertension     Patient Active Problem List   Diagnosis Date Noted  . Essential hypertension 11/29/2017  . Anxiety state 09/17/2017  . Dementia associated with other underlying disease without behavioral disturbance (HCC) 07/12/2017  . Loss of memory 06/18/2017  . Situational anxiety 06/18/2017  . Sleep disturbance 06/18/2017  . Hypokalemia 12/08/2016  . Other insomnia 10/04/2016  . Hyperthyroidism 06/19/2016  . Multiple thyroid nodules 06/19/2016  . Gastroesophageal reflux disease without esophagitis 05/11/2016  . Mixed hyperlipidemia 04/06/2016  . Adrenal incidentaloma (HCC) 06/19/2013  . Carpal tunnel syndrome on both sides 06/19/2013  . COPD (chronic  obstructive pulmonary disease) (HCC) 06/19/2013  . 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 Tobacco abuse 06/19/2013    Past Surgical History:  Procedure Laterality Date  . CHOLECYSTECTOMY    . EYE SURGERY    . gallstones      Prior to Admission medications   Medication Sig Start Date End Date Taking? Authorizing Provider  acetaminophen (TYLENOL) 500 MG tablet Take 500 mg by mouth.   Yes [provider]  aspirin EC 81 MG tablet Take 81 mg by mouth daily.    Yes [provider]  atenolol (TENORMIN) 25 MG tablet Take 25 mg by mouth daily.  07/26/17  Yes [provider]  buPROPion (WELLBUTRIN XL) 150 MG 24 hr tablet Take 150 mg by mouth daily. 08/01/19 07/31/20 Yes [provider]  donepezil (ARICEPT) 10 MG tablet Take 10 mg by mouth at bedtime.  11/13/17  Yes [provider]  losartan (COZAAR) 50 MG tablet Take 50 mg by mouth daily.  05/25/17  Yes [provider]  memantine (NAMENDA) 10 MG tablet Take 10 mg by mouth 2 (two) times daily. 07/02/19  Yes [provider]  MYRBETRIQ 25 MG TB24 tablet TAKE 1 TABLET BY MOUTH ONCE DAILY. Patient taking differently: Take 25 mg by mouth daily.  01/15/19  Yes McGowan, Carollee HerterShannon A, PA-C  QUEtiapine (SEROQUEL) 100 MG tablet Take 100 mg by mouth at bedtime.  01/21/18  Yes [provider]  simvastatin (ZOCOR) 20 MG tablet Take 20 mg by mouth. 08/22/17  09/24/19 Yes [provider]  citalopram (CELEXA) 10 MG tablet Take by mouth. 01/21/18 01/21/19  [provider]  fluticasone (FLONASE) 50 MCG/ACT nasal spray Two sprays in each nostril once daily. 02/28/18   [provider]  Multiple Vitamins-Minerals (MULTIVITAMIN WITH MINERALS) tablet Take by mouth. 06/22/16   [provider]  oxymetazoline (DRISTAN SPRAY) 0.05 % nasal spray 1 spray by Each Nare route daily as needed.    [provider]  valACYclovir (VALTREX) 1000 MG tablet  03/21/18   [provider]     Allergies Other  No family history on file.  Social History Social History   Tobacco Use  . Smoking status: Never Smoker  . Smokeless tobacco: Never Used  Substance Use Topics  . Alcohol use: No    Frequency: Never  . Drug use: No    Review of Systems  Review of Systems  Constitutional: Positive for fatigue. Negative for fever.  HENT: Negative for congestion and sore throat.   Eyes: Negative for visual disturbance.  Respiratory: Negative for cough and shortness of breath.   Cardiovascular: Negative for chest pain.  Gastrointestinal: Negative for abdominal pain, diarrhea, nausea and vomiting.  Genitourinary: Negative for flank pain.  Musculoskeletal: Positive for arthralgias and gait problem. Negative for back pain and neck pain.  Skin: Negative for rash and wound.  Neurological: Negative for weakness.  All other systems reviewed and are negative.    ____________________________________________  PHYSICAL EXAM:      VITAL SIGNS: ED Triage Vitals [09/24/19 1143]  Enc Vitals Group     BP (!) 148/69     Pulse Rate 70     Resp 18     Temp 98.9 F (37.2 C)     Temp Source Oral     SpO2 100 %     Weight 160 lb (72.6 kg)     Height 5\' 4"  (1.626 m)     Head Circumference      Peak Flow      Pain Score 2     Pain Loc      Pain Edu?      Excl. in GC?      Physical Exam Vitals signs and nursing note reviewed.  Constitutional:      General: She is not in acute distress.    Appearance: She is well-developed.  HENT:     Head: Normocephalic and atraumatic.  Eyes:     Conjunctiva/sclera: Conjunctivae normal.  Neck:     Musculoskeletal: Neck supple.  Cardiovascular:     Rate and Rhythm: Normal rate and regular rhythm.     Heart sounds: Normal heart sounds.  Pulmonary:     Effort: Pulmonary effort is normal. No respiratory distress.     Breath sounds: No wheezing.  Abdominal:     General: Abdomen is flat. There is no distension.  Skin:    General: Skin is  warm.     Capillary Refill: Capillary refill takes less than 2 seconds.     Findings: No rash.  Neurological:     Mental Status: She is alert and oriented to person, place, and time.     Motor: No abnormal muscle tone.      LOWER EXTREMITY EXAM: LEFT  INSPECTION & PALPATION: Mild shortening and external rotation of left hip, with TTP upon passive ROM.   SENSORY: sensation is intact to light touch in:  Superficial peroneal nerve distribution (over dorsum of foot) Deep peroneal nerve distribution (over first dorsal  web space) Sural nerve distribution (over lateral aspect 5th metatarsal) Saphenous nerve distribution (over medial instep)  MOTOR:  + Motor EHL (great toe dorsiflexion) + FHL (great toe plantar flexion)  + TA (ankle dorsiflexion)  + GSC (ankle plantar flexion)  VASCULAR: 2+ dorsalis pedis and posterior tibialis pulses Capillary refill < 2 sec, toes warm and well-perfused  COMPARTMENTS: Soft, warm, well-perfused No pain with passive extension No parethesias   ____________________________________________   LABS (all labs ordered are listed, but only abnormal results are displayed)  Labs Reviewed  COMPREHENSIVE METABOLIC PANEL - Abnormal; Notable for the following components:      Result Value   Potassium 2.8 (*)    Glucose, Bld 113 (*)    Calcium 8.6 (*)    Total Protein 6.4 (*)    Total Bilirubin 1.8 (*)    All other components within normal limits  SARS CORONAVIRUS 2 BY RT PCR (HOSPITAL ORDER, [facility])  CBC WITH DIFFERENTIAL/PLATELET  SAMPLE TO BLOOD BANK  TROPONIN I (HIGH SENSITIVITY)  TROPONIN I (HIGH SENSITIVITY)    ____________________________________________  EKG: Normal sinus rhythm, repol abnormality noted with nonspecific ST-t changes. LVH. VR 58, QRS 150, QTc 482. No acute ischemia or infarct. ________________________________________  RADIOLOGY All imaging, including plain films, CT scans, and ultrasounds,  independently reviewed by me, and interpretations confirmed via formal radiology reads.  ED MD interpretation:   CXR: Clear XR Hip: L subcapital femoral fx CT Head: [place]  Official radiology report(s): Dg Chest 1 View  Result Date: 09/24/2019 CLINICAL DATA:  Fall yesterday on left hip EXAM: CHEST  1 VIEW COMPARISON:  None. FINDINGS: Normal heart size. Normal mediastinal contour. No pneumothorax. No pleural effusion. Lungs appear clear, with no acute consolidative airspace disease and no pulmonary edema. IMPRESSION: No active disease. Electronically Signed   By: Ilona Sorrel M.D.   On: 09/24/2019 12:33   Ct Head Wo Contrast  Result Date: 09/24/2019 CLINICAL DATA:  Status post fall yesterday.  Initial encounter. EXAM: CT HEAD WITHOUT CONTRAST TECHNIQUE: Contiguous axial images were obtained from the base of the skull through the vertex without intravenous contrast. COMPARISON:  None. FINDINGS: Brain: No evidence of acute infarction, hemorrhage, hydrocephalus, extra-axial collection or mass lesion/mass effect. Atrophy and chronic microvascular ischemic change noted. Vascular: No hyperdense vessel or unexpected calcification. Skull: Intact.  No focal lesion. Sinuses/Orbits: Negative. Other: None. IMPRESSION: No acute abnormality. Atrophy and chronic microvascular ischemic change. Electronically Signed   By: Inge Rise M.D.   On: 09/24/2019 14:02   Dg Hip Unilat With Pelvis 2-3 Views Left  Result Date: 09/24/2019 CLINICAL DATA:  Left hip pain secondary to a fall yesterday. EXAM: DG HIP (WITH OR WITHOUT PELVIS) 2-3V LEFT COMPARISON:  None. FINDINGS: There is an impacted subcapital fracture left femoral neck with slight angulation. No dislocation. The pelvic bones are intact. IMPRESSION: Impacted subcapital fracture of the left femoral neck with slight angulation. Electronically Signed   By: Lorriane Shire M.D.   On: 09/24/2019 12:32    ____________________________________________   PROCEDURES   Procedure(s) performed (including Critical Care):  Procedures  ____________________________________________  INITIAL IMPRESSION / MDM / [place] / ED COURSE  As part of my medical decision making, I reviewed the following data within the electronic MEDICAL RECORD NUMBER Notes from prior ED visits and [place] Controlled Substance Database      *Brennah Quraishi was evaluated in Emergency Department on 09/24/2019 for the symptoms described in the history of present illness.  She was evaluated in the context of the global COVID-19 pandemic, which necessitated consideration that the patient might be at risk for infection with the SARS-CoV-2 virus that causes COVID-19. Institutional protocols and algorithms that pertain to the evaluation of patients at risk for COVID-19 are in a state of rapid change based on information released by regulatory bodies including the CDC and federal and state organizations. These policies and algorithms were followed during the patient's care in the ED.  Some ED evaluations and interventions may be delayed as a result of limited staffing during the pandemic.*      Medical Decision Making:  73 yo F here with L hip fracture after suspected mechanical fall, less likely syncopal episode. No arrhythmia on tele or EKG. She has repol abnormality on EKG - no priors in our system, but there is mention of this and ST abnormalities on prior EKGs from [facility]where. She is also hypoK. Trop neg, no CP, doubt ACS. Will replete, admit. Dr. Odis Luster of Ortho consulted re: hip fx. NPO for now. Did have something to eat today and pepsi while in waiting room. COVID ordered.  ____________________________________________  FINAL CLINICAL IMPRESSION(S) / ED DIAGNOSES  Final diagnoses:  Closed fracture of left hip, initial encounter (HCC)  Hypokalemia     MEDICATIONS GIVEN DURING THIS VISIT:  Medications  0.9 %  sodium chloride infusion (has no administration in time  range)  fentaNYL (SUBLIMAZE) injection 25 mcg (25 mcg Intravenous Given 09/24/19 1412)  potassium chloride SA (KLOR-CON) CR tablet 40 mEq (40 mEq Oral Given 09/24/19 1446)     ED Discharge Orders    None       Note:  This document was prepared using Dragon voice recognition software and may include unintentional dictation errors.   Shaune Pollack, MD 09/24/19 [phone]

## 2019-09-24 NOTE — Progress Notes (Signed)
Patient ID: Erica Walker, female   DOB: January 31, 1946, 73 y.o.   MRN: [id number]  ACP note  Patient present.  Permission to speak in front of her friend at the bedside.  Patient came in after a fall and having left hip pain.  Patient is not the best historian.  No complaints of chest pain.  She was found to have a left hip fracture.  CODE STATUS discussed.  Patient wishes to be a full code.  Plan. For the patient's left hip fracture the patient will require operative repair.  Will replace potassium.  Check magnesium and replace if low.  Hold the patient's beta-blocker secondary to bradycardia.  Patient will likely need rehab.  Time spent on ACP discussion 17 minutes Dr Loletha Grayer

## 2019-09-24 NOTE — ED Triage Notes (Signed)
Mechanical fell yesterday, pain to left hip and leg since. Able to walk but with pain. Pt alert and oriented X4, cooperative, RR even and unlabored, color WNL. Pt in NAD.

## 2019-09-24 NOTE — ED Notes (Signed)
This Rn transporter pt to room 143

## 2019-09-24 NOTE — Consult Note (Signed)
ORTHOPAEDIC CONSULTATION  REQUESTING PHYSICIAN: Alford Highland, MD  Chief Complaint: left hip pain  HPI: Erica Walker is a 73 y.o. female who complains of left hip pain after mechanical fall yesterday. Please see H&P and ED notes for details. Denies any numbness, tingling or constitutional symptoms.  Past Medical History:  Diagnosis Date  . Acid reflux   . Anxiety   . Dementia (HCC)   . Depression   . Hypertension    Past Surgical History:  Procedure Laterality Date  . CHOLECYSTECTOMY    . EYE SURGERY    . gallstones     Social History   Socioeconomic History  . Marital status: Single    Spouse name: Not on file  . Number of children: Not on file  . Years of education: Not on file  . Highest education level: Not on file  Occupational History  . Not on file  Social Needs  . Financial resource strain: Not on file  . Food insecurity    Worry: Not on file    Inability: Not on file  . Transportation needs    Medical: Not on file    Non-medical: Not on file  Tobacco Use  . Smoking status: Never Smoker  . Smokeless tobacco: Never Used  Substance and Sexual Activity  . Alcohol use: No    Frequency: Never  . Drug use: No  . Sexual activity: Not Currently  Lifestyle  . Physical activity    Days per week: Not on file    Minutes per session: Not on file  . Stress: Not on file  Relationships  . Social Musician on phone: Not on file    Gets together: Not on file    Attends religious service: Not on file    Active member of club or organization: Not on file    Attends meetings of clubs or organizations: Not on file    Relationship status: Not on file  Other Topics Concern  . Not on file  Social History Narrative  . Not on file   Family History  Problem Relation Age of Onset  . CAD Father    Allergies  Allergen Reactions  . Other Rash    Allergy to [place]-tab   Prior to Admission medications   Medication Sig Start Date End Date Taking?  Authorizing Provider  acetaminophen (TYLENOL) 500 MG tablet Take 500 mg by mouth.   Yes [provider]  aspirin EC 81 MG tablet Take 81 mg by mouth daily.    Yes [provider]  atenolol (TENORMIN) 25 MG tablet Take 25 mg by mouth daily.  07/26/17  Yes [provider]  buPROPion (WELLBUTRIN XL) 150 MG 24 hr tablet Take 150 mg by mouth daily. 08/01/19 07/31/20 Yes [provider]  donepezil (ARICEPT) 10 MG tablet Take 10 mg by mouth at bedtime.  11/13/17  Yes [provider]  losartan (COZAAR) 50 MG tablet Take 50 mg by mouth daily.  05/25/17  Yes [provider]  memantine (NAMENDA) 10 MG tablet Take 10 mg by mouth 2 (two) times daily. 07/02/19  Yes [provider]  MYRBETRIQ 25 MG TB24 tablet TAKE 1 TABLET BY MOUTH ONCE DAILY. Patient taking differently: Take 25 mg by mouth daily.  01/15/19  Yes McGowan, Carollee Herter A, PA-C  QUEtiapine (SEROQUEL) 100 MG tablet Take 100 mg by mouth at bedtime.  01/21/18  Yes [provider]  simvastatin (ZOCOR) 20 MG tablet Take 20 mg by  mouth. 08/22/17 09/24/19 Yes [provider]  citalopram (CELEXA) 10 MG tablet Take by mouth. 01/21/18 01/21/19  [provider]  fluticasone (FLONASE) 50 MCG/ACT nasal spray Two sprays in each nostril once daily. 02/28/18   [provider]  Multiple Vitamins-Minerals (MULTIVITAMIN WITH MINERALS) tablet Take by mouth. 06/22/16   [provider]  oxymetazoline (DRISTAN SPRAY) 0.05 % nasal spray 1 spray by Each Nare route daily as needed.    [provider]  valACYclovir (VALTREX) 1000 MG tablet  03/21/18   [provider]   Dg Chest 1 View  Result Date: 09/24/2019 CLINICAL DATA:  Fall yesterday on left hip EXAM: CHEST  1 VIEW COMPARISON:  None. FINDINGS: Normal heart size. Normal mediastinal contour. No pneumothorax. No pleural effusion. Lungs appear clear, with no acute consolidative airspace disease and no pulmonary edema.  IMPRESSION: No active disease. Electronically Signed   By: Ilona Sorrel M.D.   On: 09/24/2019 12:33   Ct Head Wo Contrast  Result Date: 09/24/2019 CLINICAL DATA:  Status post fall yesterday.  Initial encounter. EXAM: CT HEAD WITHOUT CONTRAST TECHNIQUE: Contiguous axial images were obtained from the base of the skull through the vertex without intravenous contrast. COMPARISON:  None. FINDINGS: Brain: No evidence of acute infarction, hemorrhage, hydrocephalus, extra-axial collection or mass lesion/mass effect. Atrophy and chronic microvascular ischemic change noted. Vascular: No hyperdense vessel or unexpected calcification. Skull: Intact.  No focal lesion. Sinuses/Orbits: Negative. Other: None. IMPRESSION: No acute abnormality. Atrophy and chronic microvascular ischemic change. Electronically Signed   By: Inge Rise M.D.   On: 09/24/2019 14:02   Dg Hip Unilat With Pelvis 2-3 Views Left  Result Date: 09/24/2019 CLINICAL DATA:  Left hip pain secondary to a fall yesterday. EXAM: DG HIP (WITH OR WITHOUT PELVIS) 2-3V LEFT COMPARISON:  None. FINDINGS: There is an impacted subcapital fracture left femoral neck with slight angulation. No dislocation. The pelvic bones are intact. IMPRESSION: Impacted subcapital fracture of the left femoral neck with slight angulation. Electronically Signed   By: Lorriane Shire M.D.   On: 09/24/2019 12:32    Positive ROS: All other systems have been reviewed and were otherwise negative with the exception of those mentioned in the HPI and as above.  Physical Exam: General: Alert, no acute distress Cardiovascular: No pedal edema Respiratory: No cyanosis, no use of accessory musculature GI: No organomegaly, abdomen is soft and non-tender Skin: No lesions in the area of chief complaint Neurologic: Sensation intact distally Psychiatric: Patient is competent for consent with normal mood and affect Lymphatic: No axillary or cervical lymphadenopathy  MUSCULOSKELETAL:  left leg flexed, internally rotated. Compartments soft. Good cap refill. Motor and sensory intact distally.  Assessment: Left hip femoral neck fracture, closed, displaced  Plan: We discussed all of the treatment options and she has elected to proceed with left hip hemiarthroplasty, anterior approach. We will plan for this tomorrow afternoon if cleared by Dr. Leslye Peer.  The diagnosis, risks, benefits and alternatives to treatment are all discussed in detail with the patient and family. Risks include but are not limited to bleeding, infection, deep vein thrombosis, pulmonary embolism, nerve or vascular injury, non-union, repeat operation, persistent pain, weakness, stiffness and death. She understands and is eager to proceed.     Lovell Sheehan, MD    09/24/2019 6:07 PM

## 2019-09-24 NOTE — H&P (Signed)
[facility] - [place] at [facility]   PATIENT NAME: Erica Walker    MR#:  [id number]  DATE OF BIRTH:  [birth year]  DATE OF ADMISSION:  09/24/2019  PRIMARY CARE PHYSICIAN: Sharilyn Sites, MD   REQUESTING/REFERRING PHYSICIAN: Dr Shaune Pollack  CHIEF COMPLAINT:   Chief Complaint  Patient presents with  . Fall    HISTORY OF PRESENT ILLNESS:  Erica Walker  is a 73 y.o. female with a known history of dementia presents after a fall.  The patient is not the best historian.  She states that she went off the bed and was walking and fell over and landed on her hip yesterday.  She had some pain there.  Pain started worsening overnight and could not walk today.  She lives at [facility] alone but usually stays with her neighbor who is also elderly.  She has a friend that helps out and the friend is currently at the bedside.  PAST MEDICAL HISTORY:   Past Medical History:  Diagnosis Date  . Acid reflux   . Anxiety   . Depression   . Hypertension     PAST SURGICAL HISTORY:   Past Surgical History:  Procedure Laterality Date  . CHOLECYSTECTOMY    . EYE SURGERY    . gallstones      SOCIAL HISTORY:   Social History   Tobacco Use  . Smoking status: Never Smoker  . Smokeless tobacco: Never Used  Substance Use Topics  . Alcohol use: No    Frequency: Never    FAMILY HISTORY:   Family History  Problem Relation Age of Onset  . CAD Father     DRUG ALLERGIES:   Allergies  Allergen Reactions  . Other Rash    Allergy to Bel-tab    REVIEW OF SYSTEMS:  CONSTITUTIONAL: No fever.positive for chills and sweats.  Some fatigue or weakness.  EYES: No blurred or double vision.  Wears glasses. EARS, NOSE, AND THROAT: No tinnitus or ear pain. No sore throat RESPIRATORY: No cough.some shortness of breath, no wheezing or hemoptysis.  CARDIOVASCULAR: No chest pain, orthopnea, edema.  GASTROINTESTINAL: No nausea, vomiting, diarrhea or abdominal pain. No  blood in bowel movements GENITOURINARY: No dysuria, hematuria.  ENDOCRINE: No polyuria, nocturia,  HEMATOLOGY: No anemia, easy bruising or bleeding SKIN: No rash or lesion. MUSCULOSKELETAL: Positive hip pain NEUROLOGIC: No tingling, numbness, weakness.  PSYCHIATRY: Positive for anxiety  MEDICATIONS AT HOME:   Prior to Admission medications   Medication Sig Start Date End Date Taking? Authorizing Provider  acetaminophen (TYLENOL) 500 MG tablet Take 500 mg by mouth.   Yes [provider]  aspirin EC 81 MG tablet Take 81 mg by mouth daily.    Yes [provider]  atenolol (TENORMIN) 25 MG tablet Take 25 mg by mouth daily.  07/26/17  Yes [provider]  buPROPion (WELLBUTRIN XL) 150 MG 24 hr tablet Take 150 mg by mouth daily. 08/01/19 07/31/20 Yes [provider]  donepezil (ARICEPT) 10 MG tablet Take 10 mg by mouth at bedtime.  11/13/17  Yes [provider]  losartan (COZAAR) 50 MG tablet Take 50 mg by mouth daily.  05/25/17  Yes [provider]  memantine (NAMENDA) 10 MG tablet Take 10 mg by mouth 2 (two) times daily. 07/02/19  Yes [provider]  MYRBETRIQ 25 MG TB24 tablet TAKE 1 TABLET BY MOUTH ONCE DAILY. Patient taking differently: Take 25 mg by mouth daily.  01/15/19  Yes McGowan, Wellington Hampshire, PA-C  QUEtiapine (SEROQUEL) 100 MG tablet Take 100 mg by mouth at bedtime.  01/21/18  Yes [provider]  simvastatin (ZOCOR) 20 MG tablet Take 20 mg by mouth. 08/22/17 09/24/19 Yes [provider]  citalopram (CELEXA) 10 MG tablet Take by mouth. 01/21/18 01/21/19  [provider]  fluticasone (FLONASE) 50 MCG/ACT nasal spray Two sprays in each nostril once daily. 02/28/18   [provider]  Multiple Vitamins-Minerals (MULTIVITAMIN WITH MINERALS) tablet Take by mouth. 06/22/16   [provider]  oxymetazoline (DRISTAN SPRAY) 0.05 % nasal spray 1 spray by Each Nare route daily as needed.    [provider]  valACYclovir (VALTREX) 1000 MG tablet  03/21/18   [provider]      VITAL SIGNS:  Blood pressure (!) 148/69, pulse (!) 57, temperature 98.9 F (37.2 C), temperature source Oral, resp. rate 16, height 5\' 4"  (1.626 m), weight 72.6 kg, SpO2 97 %.  PHYSICAL EXAMINATION:  GENERAL:  73 y.o.-year-old patient lying in the bed with no acute distress.  EYES: Pupils equal, round, reactive to light and accommodation. No scleral icterus. Extraocular muscles intact.  HEENT: Head atraumatic, normocephalic. Oropharynx and nasopharynx clear.  NECK:  Supple, no jugular venous distention. No thyroid enlargement, no tenderness.  LUNGS: Normal breath sounds bilaterally, no wheezing, rales,rhonchi or crepitation. No use of accessory muscles of respiration.  CARDIOVASCULAR: S1, S2 normal. No murmurs, rubs, or gallops.  ABDOMEN: Soft, nontender, nondistended. Bowel sounds present. No organomegaly or mass.  EXTREMITIES: Trace pedal edema, no cyanosis, or clubbing.  Left leg shortened and externally rotated. NEUROLOGIC: Cranial nerves II through XII are intact.  Power 5 out of 5 upper extremities and right lower extremity PSYCHIATRIC: The patient is alert and oriented x 3.  SKIN: No rash, lesion, or ulcer.   LABORATORY PANEL:   CBC Recent Labs  Lab 09/24/19 1256  WBC 8.4  HGB 13.5  HCT 40.4  PLT 154   ------------------------------------------------------------------------------------------------------------------  Chemistries  Recent Labs  Lab 09/24/19 1256  NA 139  K 2.8*  CL 101  CO2 26  GLUCOSE 113*  BUN 8  CREATININE 0.84  CALCIUM 8.6*  AST 26  ALT 21  ALKPHOS 74  BILITOT 1.8*   ------------------------------------------------------------------------------------------------------------------  Cardiac Enzymes Troponin 13  RADIOLOGY:  Dg Chest 1 View  Result Date: 09/24/2019 CLINICAL DATA:  Fall yesterday on left hip EXAM: CHEST  1 VIEW COMPARISON:   None. FINDINGS: Normal heart size. Normal mediastinal contour. No pneumothorax. No pleural effusion. Lungs appear clear, with no acute consolidative airspace disease and no pulmonary edema. IMPRESSION: No active disease. Electronically Signed   By: Ilona Sorrel M.D.   On: 09/24/2019 12:33   Ct Head Wo Contrast  Result Date: 09/24/2019 CLINICAL DATA:  Status post fall yesterday.  Initial encounter. EXAM: CT HEAD WITHOUT CONTRAST TECHNIQUE: Contiguous axial images were obtained from the base of the skull through the vertex without intravenous contrast. COMPARISON:  None. FINDINGS: Brain: No evidence of acute infarction, hemorrhage, hydrocephalus, extra-axial collection or mass lesion/mass effect. Atrophy and chronic microvascular ischemic change noted. Vascular: No hyperdense vessel or unexpected calcification. Skull: Intact.  No focal lesion. Sinuses/Orbits: Negative. Other: None. IMPRESSION: No acute abnormality. Atrophy and chronic microvascular ischemic change. Electronically Signed   By: Inge Rise M.D.   On: 09/24/2019 14:02   Dg Hip Unilat With Pelvis 2-3 Views Left  Result Date: 09/24/2019 CLINICAL DATA:  Left hip pain secondary to a fall yesterday. EXAM: DG HIP (WITH OR WITHOUT  PELVIS) 2-3V LEFT COMPARISON:  None. FINDINGS: There is an impacted subcapital fracture left femoral neck with slight angulation. No dislocation. The pelvic bones are intact. IMPRESSION: Impacted subcapital fracture of the left femoral neck with slight angulation. Electronically Signed   By: Francene BoyersJames  Maxwell M.D.   On: 09/24/2019 12:32    EKG:   Sinus bradycardia 58 bpm, LVH, flipped T waves inferiorly and laterally.  IMPRESSION AND PLAN:   1.  Preoperative evaluation for left hip fracture.  Once potassium replaced, there are no contraindications to surgery at this time. 2.  Sinus bradycardia nonspecific T wave changes laterally and inferiorly.  Patient has no complaints of chest pain.  First troponin is  negative.  Hold atenolol.  Replace electrolytes. 3.  Hypokalemia replace potassium orally and IV.  Check a magnesium and replace if low. 4.  Hypertension continue losartan and hold atenolol. 5.  Dementia continue psychiatric medications.  Cut back on the dose of Seroquel. 6.  Hyperlipidemia on Zocor 7.  Depression anxiety on Wellbutrin  All the laboratory and radiological data are reviewed and case discussed with ED provider. Management plans discussed with the patient, and the patient's friend and they are in agreement.  CODE STATUS: full code  TOTAL TIME TAKING CARE OF THIS PATIENT: 50 minutes.    Alford Highlandichard Naim Murtha M.D on 09/24/2019 at 4:22 PM  Between 7am to 6pm - Pager - [phone]  After 6pm call admission pager [phone]  [employer] Office  [phone]  CC: Primary care physician; Sharilyn SitesHeffington, Mark, MD

## 2019-09-25 ENCOUNTER — Inpatient Hospital Stay: Payer: Medicare Other

## 2019-09-25 ENCOUNTER — Encounter: Payer: Self-pay | Admitting: Internal Medicine

## 2019-09-25 ENCOUNTER — Inpatient Hospital Stay: Payer: Medicare Other | Admitting: Anesthesiology

## 2019-09-25 ENCOUNTER — Encounter
Admission: EM | Disposition: A | Discharge status: Skilled Nursing Facility | Payer: Self-pay | Source: Home / Self Care | Attending: Internal Medicine

## 2019-09-25 DIAGNOSIS — L8990 Pressure ulcer of unspecified site, unspecified stage: Secondary | ICD-10-CM | POA: Insufficient documentation

## 2019-09-25 HISTORY — PX: HIP ARTHROPLASTY: SHX981

## 2019-09-25 LAB — BASIC METABOLIC PANEL
Anion gap: 8 (ref 5–15)
BUN: 8 mg/dL (ref 8–23)
CO2: 31 mmol/L (ref 22–32)
Calcium: 8.7 mg/dL — ABNORMAL LOW (ref 8.9–10.3)
Chloride: 103 mmol/L (ref 98–111)
Creatinine, Ser: 0.91 mg/dL (ref 0.44–1.00)
GFR calc Af Amer: >60 mL/min (ref >60)
GFR calc non Af Amer: >60 mL/min (ref >60)
Glucose, Bld: 113 mg/dL — ABNORMAL HIGH (ref 70–99)
Potassium: 3.6 mmol/L (ref 3.5–5.1)
Sodium: 142 mmol/L (ref 135–145)

## 2019-09-25 LAB — CBC
HCT: 40.3 % (ref 36.0–46.0)
Hemoglobin: 13.3 g/dL (ref 12.0–15.0)
MCH: 29.8 pg (ref 26.0–34.0)
MCHC: 33 g/dL (ref 30.0–36.0)
MCV: 90.4 fL (ref 80.0–100.0)
Platelets: 145 10*3/uL — ABNORMAL LOW (ref 150–400)
RBC: 4.46 MIL/uL (ref 3.87–5.11)
RDW: 12.3 % (ref 11.5–15.5)
WBC: 6 10*3/uL (ref 4.0–10.5)
nRBC: 0 % (ref 0.0–0.2)

## 2019-09-25 LAB — MAGNESIUM: Magnesium: 2.3 mg/dL (ref 1.7–2.4)

## 2019-09-25 LAB — MRSA PCR SCREENING: MRSA by PCR: NEGATIVE

## 2019-09-25 SURGERY — HEMIARTHROPLASTY, HIP, DIRECT ANTERIOR APPROACH, FOR FRACTURE
Anesthesia: Spinal | Site: Hip | Laterality: Left

## 2019-09-25 MED ORDER — EPINEPHRINE PF 1 MG/ML IJ SOLN
INTRAMUSCULAR | Status: AC
  Filled 2019-09-25: qty 1

## 2019-09-25 MED ORDER — METHOCARBAMOL 500 MG PO TABS: 500.0000 mg | ORAL_TABLET | Freq: Four times a day (QID) | ORAL | Status: DC | PRN

## 2019-09-25 MED ORDER — ENALAPRILAT 1.25 MG/ML IV SOLN
0.6250 mg | Freq: Four times a day (QID) | INTRAVENOUS | Status: DC | PRN
  Filled 2019-09-25: qty 0.5

## 2019-09-25 MED ORDER — BACITRACIN 50000 UNITS IM SOLR
INTRAMUSCULAR | Status: AC
  Filled 2019-09-25: qty 2

## 2019-09-25 MED ORDER — SODIUM CHLORIDE 0.9 % IR SOLN
Status: DC | PRN
  Administered 2019-09-25: 1000 mL

## 2019-09-25 MED ORDER — KETOROLAC TROMETHAMINE 15 MG/ML IJ SOLN
15.0000 mg | Freq: Four times a day (QID) | INTRAMUSCULAR | Status: AC
  Administered 2019-09-25 – 2019-09-26 (×3): 15 mg via INTRAVENOUS
  Filled 2019-09-25 (×3): qty 1

## 2019-09-25 MED ORDER — LACTATED RINGERS IV SOLN
INTRAVENOUS | Status: DC | PRN
  Administered 2019-09-25: 16:00:00 via INTRAVENOUS

## 2019-09-25 MED ORDER — METOCLOPRAMIDE HCL 5 MG/ML IJ SOLN: 5.0000 mg | Freq: Three times a day (TID) | INTRAMUSCULAR | Status: DC | PRN

## 2019-09-25 MED ORDER — QUETIAPINE FUMARATE 100 MG PO TABS
100.0000 mg | ORAL_TABLET | Freq: Every day | ORAL | Status: DC
  Administered 2019-09-25 – 2019-09-28 (×4): 100 mg via ORAL
  Filled 2019-09-25: qty 1
  Filled 2019-09-25: qty 4
  Filled 2019-09-25 (×3): qty 1
  Filled 2019-09-25 (×2): qty 4
  Filled 2019-09-25: qty 1

## 2019-09-25 MED ORDER — MENTHOL 3 MG MT LOZG
1.0000 | LOZENGE | OROMUCOSAL | Status: DC | PRN
  Filled 2019-09-25: qty 9

## 2019-09-25 MED ORDER — PROPOFOL 500 MG/50ML IV EMUL
INTRAVENOUS | Status: AC
  Filled 2019-09-25: qty 50

## 2019-09-25 MED ORDER — METHOCARBAMOL 1000 MG/10ML IJ SOLN
500.0000 mg | Freq: Four times a day (QID) | INTRAVENOUS | Status: DC | PRN
  Filled 2019-09-25: qty 5

## 2019-09-25 MED ORDER — BISACODYL 10 MG RE SUPP
10.0000 mg | Freq: Every day | RECTAL | Status: DC | PRN
  Administered 2019-09-27: 10 mg via RECTAL
  Filled 2019-09-25: qty 1

## 2019-09-25 MED ORDER — ONDANSETRON HCL 4 MG/2ML IJ SOLN: 4.0000 mg | Freq: Four times a day (QID) | INTRAMUSCULAR | Status: DC | PRN

## 2019-09-25 MED ORDER — MAGNESIUM CITRATE PO SOLN
1.0000 | Freq: Once | ORAL | Status: DC | PRN
  Filled 2019-09-25: qty 296

## 2019-09-25 MED ORDER — METOCLOPRAMIDE HCL 10 MG PO TABS: 5.0000 mg | ORAL_TABLET | Freq: Three times a day (TID) | ORAL | Status: DC | PRN

## 2019-09-25 MED ORDER — PHENOL 1.4 % MT LIQD
1.0000 | OROMUCOSAL | Status: DC | PRN
  Filled 2019-09-25: qty 177

## 2019-09-25 MED ORDER — ENOXAPARIN SODIUM 40 MG/0.4ML ~~LOC~~ SOLN
40.0000 mg | SUBCUTANEOUS | Status: DC
  Administered 2019-09-26 – 2019-09-29 (×4): 40 mg via SUBCUTANEOUS
  Filled 2019-09-25 (×4): qty 0.4

## 2019-09-25 MED ORDER — OXYMETAZOLINE HCL 0.05 % NA SOLN
1.0000 | Freq: Two times a day (BID) | NASAL | Status: DC | PRN
  Administered 2019-09-25 – 2019-09-29 (×2): 1 via NASAL
  Filled 2019-09-25: qty 15

## 2019-09-25 MED ORDER — DOCUSATE SODIUM 100 MG PO CAPS
100.0000 mg | ORAL_CAPSULE | Freq: Two times a day (BID) | ORAL | Status: DC
  Administered 2019-09-25 – 2019-09-29 (×7): 100 mg via ORAL
  Filled 2019-09-25 (×7): qty 1

## 2019-09-25 MED ORDER — ACETAMINOPHEN 500 MG PO TABS
500.0000 mg | ORAL_TABLET | Freq: Four times a day (QID) | ORAL | Status: AC
  Administered 2019-09-25 – 2019-09-26 (×3): 500 mg via ORAL
  Filled 2019-09-25 (×3): qty 1

## 2019-09-25 MED ORDER — LIDOCAINE HCL (CARDIAC) PF 50 MG/5ML IV SOSY
PREFILLED_SYRINGE | INTRAVENOUS | Status: DC | PRN
  Administered 2019-09-25: 3 mL via INTRAVENOUS

## 2019-09-25 MED ORDER — BUPIVACAINE HCL (PF) 0.5 % IJ SOLN
INTRAMUSCULAR | Status: DC | PRN
  Administered 2019-09-25: 3 mL

## 2019-09-25 MED ORDER — LACTATED RINGERS IV SOLN
INTRAVENOUS | Status: DC
  Administered 2019-09-25: 21:00:00 via INTRAVENOUS

## 2019-09-25 MED ORDER — ALUM & MAG HYDROXIDE-SIMETH 200-200-20 MG/5ML PO SUSP: 30.0000 mL | ORAL | Status: DC | PRN

## 2019-09-25 MED ORDER — SODIUM CHLORIDE FLUSH 0.9 % IV SOLN
INTRAVENOUS | Status: AC
  Filled 2019-09-25: qty 10

## 2019-09-25 MED ORDER — MAGNESIUM HYDROXIDE 400 MG/5ML PO SUSP: 30.0000 mL | Freq: Every day | ORAL | Status: DC | PRN

## 2019-09-25 MED ORDER — PROPOFOL 500 MG/50ML IV EMUL
INTRAVENOUS | Status: DC | PRN
  Administered 2019-09-25: 40 ug/kg/min via INTRAVENOUS

## 2019-09-25 MED ORDER — CHLORHEXIDINE GLUCONATE CLOTH 2 % EX PADS
6.0000 | MEDICATED_PAD | Freq: Every day | CUTANEOUS | Status: DC
  Administered 2019-09-25 – 2019-09-27 (×2): 6 via TOPICAL

## 2019-09-25 MED ORDER — SODIUM CHLORIDE FLUSH 0.9 % IV SOLN
INTRAVENOUS | Status: AC
  Filled 2019-09-25: qty 20

## 2019-09-25 MED ORDER — FENTANYL CITRATE (PF) 100 MCG/2ML IJ SOLN: 25.0000 ug | INTRAMUSCULAR | Status: DC | PRN

## 2019-09-25 MED ORDER — MEPERIDINE HCL 50 MG/ML IJ SOLN
12.5000 mg | Freq: Once | INTRAMUSCULAR | Status: AC
  Administered 2019-09-25: 18:00:00 12.5 mg via INTRAVENOUS

## 2019-09-25 MED ORDER — ONDANSETRON HCL 4 MG PO TABS: 4.0000 mg | ORAL_TABLET | Freq: Four times a day (QID) | ORAL | Status: DC | PRN

## 2019-09-25 MED ORDER — KETAMINE HCL 10 MG/ML IJ SOLN
INTRAMUSCULAR | Status: DC | PRN
  Administered 2019-09-25 (×2): 20 mg via INTRAVENOUS

## 2019-09-25 MED ORDER — BUPIVACAINE HCL (PF) 0.25 % IJ SOLN
INTRAMUSCULAR | Status: AC
  Filled 2019-09-25: qty 30

## 2019-09-25 MED ORDER — ONDANSETRON HCL 4 MG/2ML IJ SOLN: 4.0000 mg | Freq: Once | INTRAMUSCULAR | Status: DC | PRN

## 2019-09-25 MED ORDER — BUPIVACAINE-EPINEPHRINE (PF) 0.25% -1:200000 IJ SOLN
INTRAMUSCULAR | Status: DC | PRN
  Administered 2019-09-25: 30 mL

## 2019-09-25 MED ORDER — CEFAZOLIN SODIUM-DEXTROSE 2-4 GM/100ML-% IV SOLN
2.0000 g | Freq: Four times a day (QID) | INTRAVENOUS | Status: AC
  Administered 2019-09-25 – 2019-09-26 (×2): 2 g via INTRAVENOUS
  Filled 2019-09-25 (×2): qty 100

## 2019-09-25 MED ORDER — MEPERIDINE HCL 50 MG/ML IJ SOLN
INTRAMUSCULAR | Status: AC
  Filled 2019-09-25: qty 1

## 2019-09-25 MED ORDER — KETAMINE HCL 50 MG/ML IJ SOLN
INTRAMUSCULAR | Status: AC
  Filled 2019-09-25: qty 10

## 2019-09-25 MED ORDER — TRANEXAMIC ACID 1000 MG/10ML IV SOLN
INTRAVENOUS | Status: AC
  Filled 2019-09-25: qty 10

## 2019-09-25 SURGICAL SUPPLY — 50 items
BLADE SAGITTAL WIDE XTHICK NO (BLADE) ×3 IMPLANT
BNDG COHESIVE 4X5 TAN STRL (GAUZE/BANDAGES/DRESSINGS) ×6 IMPLANT
BNDG COHESIVE 6X5 TAN STRL LF (GAUZE/BANDAGES/DRESSINGS) ×3 IMPLANT
BRUSH SCRUB EZ  4% CHG (MISCELLANEOUS) ×4
BRUSH SCRUB EZ 4% CHG (MISCELLANEOUS) ×2 IMPLANT
CHLORAPREP W/TINT 26 (MISCELLANEOUS) ×3 IMPLANT
COVER WAND RF STERILE (DRAPES) ×3 IMPLANT
DRAPE 3/4 80X56 (DRAPES) ×6 IMPLANT
DRAPE C-ARM 42X72 X-RAY (DRAPES) ×3 IMPLANT
DRAPE STERI IOBAN 125X83 (DRAPES) ×3 IMPLANT
DRAPE SURG 17X11 SM STRL (DRAPES) ×3 IMPLANT
DRAPE U-SHAPE 47X51 STRL (DRAPES) ×3 IMPLANT
DRSG AQUACEL AG ADV 3.5X10 (GAUZE/BANDAGES/DRESSINGS) ×3 IMPLANT
DRSG AQUACEL AG ADV 3.5X14 (GAUZE/BANDAGES/DRESSINGS) ×3 IMPLANT
ELECT BLADE 6.5 EXT (BLADE) ×3 IMPLANT
ELECT REM PT RETURN 9FT ADLT (ELECTROSURGICAL) ×3
ELECTRODE REM PT RTRN 9FT ADLT (ELECTROSURGICAL) ×1 IMPLANT
GAUZE XEROFORM 1X8 LF (GAUZE/BANDAGES/DRESSINGS) ×3 IMPLANT
GLOVE INDICATOR 8.0 STRL GRN (GLOVE) ×3 IMPLANT
GLOVE SURG ORTHO 8.0 STRL STRW (GLOVE) ×3 IMPLANT
GOWN STRL REUS W/ TWL LRG LVL3 (GOWN DISPOSABLE) ×2 IMPLANT
GOWN STRL REUS W/ TWL XL LVL3 (GOWN DISPOSABLE) ×1 IMPLANT
GOWN STRL REUS W/TWL LRG LVL3 (GOWN DISPOSABLE) ×4
GOWN STRL REUS W/TWL XL LVL3 (GOWN DISPOSABLE) ×2
HEAD 28MM 0 (Hips) ×2 IMPLANT
HEAD BIPOLAR 44MM (Hips) ×2 IMPLANT
HOOD PEEL AWAY FLYTE STAYCOOL (MISCELLANEOUS) ×9 IMPLANT
IV NS 1000ML (IV SOLUTION) ×2
IV NS 1000ML BAXH (IV SOLUTION) ×1 IMPLANT
KIT PATIENT CARE HANA TABLE (KITS) ×3 IMPLANT
KIT TURNOVER CYSTO (KITS) ×3 IMPLANT
MAT ABSORB  FLUID 56X50 GRAY (MISCELLANEOUS) ×2
MAT ABSORB FLUID 56X50 GRAY (MISCELLANEOUS) ×1 IMPLANT
NDL SAFETY ECLIPSE 18X1.5 (NEEDLE) ×2 IMPLANT
NDL SPNL 20GX3.5 QUINCKE YW (NEEDLE) ×1 IMPLANT
NEEDLE HYPO 18GX1.5 SHARP (NEEDLE) ×4
NEEDLE HYPO 22GX1.5 SAFETY (NEEDLE) ×3 IMPLANT
NEEDLE SPNL 20GX3.5 QUINCKE YW (NEEDLE) ×3 IMPLANT
PACK HIP PROSTHESIS (MISCELLANEOUS) ×3 IMPLANT
PADDING CAST BLEND 4X4 NS (MISCELLANEOUS) ×6 IMPLANT
PILLOW ABDUCTION MEDIUM (MISCELLANEOUS) ×3 IMPLANT
PULSAVAC PLUS IRRIG FAN TIP (DISPOSABLE) ×3
STAPLER SKIN PROX 35W (STAPLE) ×3 IMPLANT
STEM STD COLLAR SZ2 POLARSTEM (Stem) ×2 IMPLANT
SUT BONE WAX W31G (SUTURE) ×3 IMPLANT
SUT DVC 2 QUILL PDO  T11 36X36 (SUTURE) ×2
SUT DVC 2 QUILL PDO T11 36X36 (SUTURE) ×1 IMPLANT
SUT VIC AB 2-0 CT1 18 (SUTURE) ×3 IMPLANT
SYR 20ML LL LF (SYRINGE) ×3 IMPLANT
TIP FAN IRRIG PULSAVAC PLUS (DISPOSABLE) ×1 IMPLANT

## 2019-09-25 NOTE — TOC Initial Note (Signed)
Transition of Care [facility]) - Initial/Assessment Note    Patient Details  Name: Erica Walker MRN: [id number] Date of Birth: 01-14-1946  [facility]) CM/SW Contact:    Keimon Basaldua, Lenice Llamas Phone Number: [phone]  09/25/2019, 5:30 PM  Clinical Narrative: Clinical Social Worker (CSW) met with patient and her friend/ HPOA Erica Walker was at bedside. Patient was alert and oriented X3 and was laying in the bed. CSW introduced self and explained role of CSW department. Per patient she lives alone in an apartment at [facility] in [place]. Per Erica Walker she is patient's friend and HPOA and lives in [place]. CSW explained that PT will evaluate patient after surgery and make a recommendation of home health or SNF. Patient and Erica Walker prefer SNF and are agreeable to SNF search in [place]. FL2 complete and faxed out.   CSW presented SNF bed offers to patient and Erica Walker. CSW provided patient with CMS SNF list. Patient and Erica Walker chose [facility] in [place]. CSW explained that medicare requires a 3 night inpatient hospital stay in order to pay for SNF. Patient was admitted to inpatient on 10/28. [facility] admissions coordinator at [facility] is aware of accepted bed offer. CSW will continue to follow and assist as needed.                 Expected Discharge Plan: Skilled Nursing Facility Barriers to Discharge: Continued Medical Work up   Patient Goals and CMS Choice Patient states their goals for this hospitalization and ongoing recovery are:: To go to rehab. CMS Medicare.gov Compare Post Acute Care list provided to:: Patient Choice offered to / list presented to : Patient  Expected Discharge Plan and Services Expected Discharge Plan: [place] In-house Referral: Clinical Social Work   Post Acute Care Choice: [place] Living arrangements for the past 2 months: Apartment                                      Prior Living Arrangements/Services Living  arrangements for the past 2 months: Apartment Lives with:: Self Patient language and need for interpreter reviewed:: No Do you feel safe going back to the place where you live?: Yes      Need for Family Participation in Patient Care: Yes (Comment) Care giver support system in place?: No (comment)   Criminal Activity/Legal Involvement Pertinent to Current Situation/Hospitalization: No - Comment as needed  Activities of Daily Living Home Assistive Devices/Equipment: Eyeglasses ADL Screening (condition at time of admission) Patient's cognitive ability adequate to safely complete daily activities?: Yes Is the patient deaf or have difficulty hearing?: No Does the patient have difficulty seeing, even when wearing glasses/contacts?: No Does the patient have difficulty concentrating, remembering, or making decisions?: No Patient able to express need for assistance with ADLs?: Yes Does the patient have difficulty dressing or bathing?: No Independently performs ADLs?: Yes (appropriate for developmental age) Does the patient have difficulty walking or climbing stairs?: No Weakness of Legs: None Weakness of Arms/Hands: None  Permission Sought/Granted Permission sought to share information with : Chartered certified accountant granted to share information with : Yes, Verbal Permission Granted              Emotional Assessment Appearance:: Appears stated age Attitude/Demeanor/Rapport: Engaged Affect (typically observed): Pleasant, Calm Orientation: : Oriented to Self, Oriented to Place, Oriented to  Time, Oriented to Situation Alcohol / Substance Use: Not Applicable  Psych Involvement: No (comment)  Admission diagnosis:  Hypokalemia [E87.6] Fall [W19.XXXA] Closed fracture of left hip, initial encounter [facility]) [S72.002A] Patient Active Problem List   Diagnosis Date Noted  . Pressure injury of skin 09/25/2019  . Hip fracture ([place]) 09/24/2019  . Essential hypertension  11/29/2017  . Anxiety state 09/17/2017  . Dementia associated with other underlying disease without behavioral disturbance ([place]) 07/12/2017  . Loss of memory 06/18/2017  . Situational anxiety 06/18/2017  . Sleep disturbance 06/18/2017  . Hypokalemia 12/08/2016  . Other insomnia 10/04/2016  . Hyperthyroidism 06/19/2016  . Multiple thyroid nodules 06/19/2016  . Gastroesophageal reflux disease without esophagitis 05/11/2016  . Mixed hyperlipidemia 04/06/2016  . Adrenal incidentaloma ([place]) 06/19/2013  . Carpal tunnel syndrome on both sides 06/19/2013  . COPD (chronic obstructive pulmonary disease) ([place]) 06/19/2013  . 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 Tobacco abuse 06/19/2013   PCP:  Marygrace Drought, MD Pharmacy:   [place], [place] [address] Phone: [phone] Fax: [phone]     Social Determinants of Health (SDOH) Interventions    Readmission Risk Interventions No flowsheet data found.

## 2019-09-25 NOTE — Anesthesia Preprocedure Evaluation (Signed)
Anesthesia Evaluation  Patient identified by MRN, date of birth, ID band Patient awake    Reviewed: Allergy & Precautions, NPO status , Patient's Chart, lab work & pertinent test results  History of Anesthesia Complications Negative for: history of anesthetic complications  Airway Mallampati: II       Dental  (+) Edentulous Upper, Edentulous Lower   Pulmonary neg sleep apnea, COPD (no inhlaer, no oxygen), Not current smoker,           Cardiovascular hypertension, Pt. on medications and Pt. on home beta blockers (-) Past MI and (-) CHF (-) dysrhythmias (-) Valvular Problems/Murmurs     Neuro/Psych neg Seizures Anxiety Depression Dementia    GI/Hepatic Neg liver ROS, GERD  ,  Endo/Other  neg diabetes  Renal/GU negative Renal ROS     Musculoskeletal   Abdominal   Peds  Hematology   Anesthesia Other Findings   Reproductive/Obstetrics                             Anesthesia Physical Anesthesia Plan  ASA: III  Anesthesia Plan: Spinal   Post-op Pain Management:    Induction:   PONV Risk Score and Plan:   Airway Management Planned:   Additional Equipment:   Intra-op Plan:   Post-operative Plan:   Informed Consent: I have reviewed the patients History and Physical, chart, labs and discussed the procedure including the risks, benefits and alternatives for the proposed anesthesia with the patient or authorized representative who has indicated his/her understanding and acceptance.       Plan Discussed with:   Anesthesia Plan Comments:         Anesthesia Quick Evaluation

## 2019-09-25 NOTE — Anesthesia Post-op Follow-up Note (Signed)
Anesthesia QCDR form completed.        

## 2019-09-25 NOTE — Anesthesia Procedure Notes (Signed)
Spinal  Patient location during procedure: OR Start time: 09/25/2019 3:45 PM End time: 09/25/2019 3:52 PM Staffing Anesthesiologist: Gunnar Bulla, MD Resident/CRNA: Jerrye Noble, CRNA Performed: resident/CRNA  Preanesthetic Checklist Completed: patient identified, site marked, surgical consent, pre-op evaluation, timeout performed, IV checked, risks and benefits discussed and monitors and equipment checked Spinal Block Patient position: right lateral decubitus Prep: ChloraPrep Patient monitoring: continuous pulse ox and blood pressure Approach: midline Location: L3-4 Injection technique: single-shot Needle Needle type: Quincke  Needle gauge: 22 G

## 2019-09-25 NOTE — Progress Notes (Signed)
[facility] - [place] at [facility]   PATIENT NAME: Erica Walker    MR#:  [id number]  DATE OF BIRTH:  [birth year]  SUBJECTIVE:  CHIEF COMPLAINT:   Chief Complaint  Patient presents with  . Fall   Patient confused overnight.  Feeling better today.  Waiting for surgery.  REVIEW OF SYSTEMS:    Review of Systems  Unable to perform ROS: Dementia   DRUG ALLERGIES:   Allergies  Allergen Reactions  . Other Rash    Allergy to Bel-tab    VITALS:  Blood pressure (!) 176/82, pulse 78, temperature 97.8 F (36.6 C), temperature source Oral, resp. rate 18, height 5\' 4"  (1.626 m), weight 72.6 kg, SpO2 95 %.  PHYSICAL EXAMINATION:   Physical Exam  GENERAL:  73 y.o.-year-old patient lying in the bed with no acute distress.  EYES: Pupils equal, round, reactive to light and accommodation. No scleral icterus. Extraocular muscles intact.  HEENT: Head atraumatic, normocephalic. Oropharynx and nasopharynx clear.  NECK:  Supple, no jugular venous distention. No thyroid enlargement, no tenderness.  LUNGS: Normal breath sounds bilaterally, no wheezing, rales, rhonchi. No use of accessory muscles of respiration.  CARDIOVASCULAR: S1, S2 normal. No murmurs, rubs, or gallops.  ABDOMEN: Soft, nontender, nondistended. Bowel sounds present. No organomegaly or mass.  EXTREMITIES: No cyanosis, clubbing or edema b/l.    NEUROLOGIC: Cranial nerves II through XII are intact. No focal Motor or sensory deficits b/l.   PSYCHIATRIC: The patient is alert and awake SKIN: No obvious rash, lesion, or ulcer.   LABORATORY PANEL:   CBC Recent Labs  Lab 09/25/19 0636  WBC 6.0  HGB 13.3  HCT 40.3  PLT 145*   ------------------------------------------------------------------------------------------------------------------ Chemistries  Recent Labs  Lab 09/24/19 1256  09/25/19 0636  NA 139  --  142  K 2.8*  --  3.6  CL 101  --  103  CO2 26  --  31  GLUCOSE 113*  --  113*  BUN 8  --  8   CREATININE 0.84  --  0.91  CALCIUM 8.6*  --  8.7*  MG  --    < > 2.3  AST 26  --   --   ALT 21  --   --   ALKPHOS 74  --   --   BILITOT 1.8*  --   --    < > = values in this interval not displayed.   ------------------------------------------------------------------------------------------------------------------  Cardiac Enzymes No results for input(s): TROPONINI in the last 168 hours. ------------------------------------------------------------------------------------------------------------------  RADIOLOGY:  Dg Chest 1 View  Result Date: 09/24/2019 CLINICAL DATA:  Fall yesterday on left hip EXAM: CHEST  1 VIEW COMPARISON:  None. FINDINGS: Normal heart size. Normal mediastinal contour. No pneumothorax. No pleural effusion. Lungs appear clear, with no acute consolidative airspace disease and no pulmonary edema. IMPRESSION: No active disease. Electronically Signed   By: 09/26/2019 M.D.   On: 09/24/2019 12:33   Ct Head Wo Contrast  Result Date: 09/24/2019 CLINICAL DATA:  Status post fall yesterday.  Initial encounter. EXAM: CT HEAD WITHOUT CONTRAST TECHNIQUE: Contiguous axial images were obtained from the base of the skull through the vertex without intravenous contrast. COMPARISON:  None. FINDINGS: Brain: No evidence of acute infarction, hemorrhage, hydrocephalus, extra-axial collection or mass lesion/mass effect. Atrophy and chronic microvascular ischemic change noted. Vascular: No hyperdense vessel or unexpected calcification. Skull: Intact.  No focal lesion. Sinuses/Orbits: Negative. Other: None. IMPRESSION: No acute abnormality. Atrophy and chronic microvascular ischemic change. Electronically  Signed   By: Inge Rise M.D.   On: 09/24/2019 14:02   Dg Hip Unilat With Pelvis 2-3 Views Left  Result Date: 09/24/2019 CLINICAL DATA:  Left hip pain secondary to a fall yesterday. EXAM: DG HIP (WITH OR WITHOUT PELVIS) 2-3V LEFT COMPARISON:  None. FINDINGS: There is an impacted  subcapital fracture left femoral neck with slight angulation. No dislocation. The pelvic bones are intact. IMPRESSION: Impacted subcapital fracture of the left femoral neck with slight angulation. Electronically Signed   By: Lorriane Shire M.D.   On: 09/24/2019 12:32     ASSESSMENT AND PLAN:   * left hip fracture Surgery later today.  NPO. Pain medications as needed Physical therapy evaluation after surgery DVT prophylaxis per orthopedic team SNF admission at discharge  *Mild asymptomatic sinus bradycardia Stable  *Hypokalemia.  Replaced and normalized  * Hypertension continue losartan and hold atenolol.  *  Dementia continue psychiatric medications.  Cut back on the dose of Seroquel.  *  Hyperlipidemia on Zocor  *  Depression anxiety on Wellbutrin  All the records are reviewed and case discussed with Care Management/Social Workerr Management plans discussed with the patient, family and they are in agreement.  CODE STATUS: FULL CODE  DVT Prophylaxis: SCDs  TOTAL TIME TAKING CARE OF THIS PATIENT: 35 minutes.   POSSIBLE D/C IN 2-3 DAYS, DEPENDING ON CLINICAL CONDITION.  Leia Alf Isaic Syler M.D on 09/25/2019 at 12:20 PM  Between 7am to 6pm - Pager - [phone]  After 6pm go to [email or web address] - password [email or web address] [facility]  Office  [phone]  CC: Primary care physician; Marygrace Drought, MD  Note: This dictation was prepared with Dragon dictation along with smaller phrase technology. Any transcriptional errors that result from this process are unintentional.

## 2019-09-25 NOTE — Op Note (Signed)
09/25/2019  5:27 PM  PATIENT:  Erica Walker   MRN: [id number]  PRE-OPERATIVE DIAGNOSIS:  Displaced Subcapital fracture left3 hip   POST-OPERATIVE DIAGNOSIS: Same  Procedure: Left Hip Anterior Hip Hemiarthroplasty   Surgeon: Elyn Aquas. Harlow Mares, MD   Anesthesia: Spinal   EBL: 100 mL   Specimens: None   Drains: None   Components used: A size 2 Polarstem [employer], a 44 mm bipolar head    Description of the procedure in detail: After informed consent was obtained and the appropriate extremity marked in the pre-operative holding area, the patient was taken to the operating room and placed in the supine position on the fracture table. All pressure points were well padded and bilateral lower extremities were place in traction spars. The hip was prepped and draped in standard sterile fashion. A spinal anesthetic had been delivered by the anesthesia team. The skin and subcutaneous tissues were injected with a mixture of Marcaine with epinephrine for post-operative pain. A longitudinal incision approximately 10 cm in length was carried out from the anterior superior iliac spine to the greater trochanter. The tensor fascia was divided and blunt dissection was taken down to the level of the joint capsule. The lateral circumflex vessels were cauterized. Deep retractors were placed and a portion of the anterior capsule was excised. Using fluoroscopy the neck cut was planned and carried out with a sagittal saw. The head was passed from the field with use of a corkscrew and hip skid. Deep retractors were placed along the acetabulum and bony and soft tissue debris was removed.   Attention was then turned to the proximal femur. The leg was placed in extension and external rotation. The canal was opened and sequentially broached to a size 2. The trial components were placed and the hip relocated. The components were found to be in good position using fluoroscopy. The hip was dislocated and the trial  components removed. The final components were impacted in to position and the hip relocated. The final components were again check with fluoroscopy and found to be in good position. Hemostasis was achieved with electrocautery. The deep capsule was injected with Marcaine and epinephrine. The wound was irrigated with bacitracin laced normal saline and the tensor fascia closed with #2 Quill suture. The subcutaneous tissues were closed with 2-0 vicryl and staples for the skin. A sterile dressing was applied and an abduction pillow. Patient tolerated the procedure well and there were no apparent complication. Patient was taken to the recovery room in good condition.    Elyn Aquas. Harlow Mares, MD  09/25/2019 5:27 PM

## 2019-09-25 NOTE — Transfer of Care (Signed)
Immediate Anesthesia Transfer of Care Note  Patient: Erica Walker  Procedure(s) Performed: ARTHROPLASTY BIPOLAR HIP (HEMIARTHROPLASTY) (Left Hip)  Patient Location: PACU  Anesthesia Type:Spinal  Level of Consciousness: awake and alert   Airway & Oxygen Therapy: Patient Spontanous Breathing  Post-op Assessment: Report given to RN and Post -op Vital signs reviewed and stable  Post vital signs: Reviewed and stable  Last Vitals:  Vitals Value Taken Time  BP 123/80 09/25/19 1718  Temp 36.6 C 09/25/19 1718  Pulse 77 09/25/19 1726  Resp 14 09/25/19 1726  SpO2 98 % 09/25/19 1726  Vitals shown include unvalidated device data.  Last Pain:  Vitals:   09/25/19 1718  TempSrc:   PainSc: 0-No pain         Complications: No apparent anesthesia complications

## 2019-09-25 NOTE — NC FL2 (Signed)
[place] LEVEL OF CARE SCREENING TOOL     IDENTIFICATION  Patient Name: Erica Walker Birthdate: 1946-05-04 Sex: female Admission Date (Current Location): 09/24/2019  [place] and [place] Number:  Engineering geologist and Address:  [facility], [address], [place], [place] [address]      Provider Number: [id number]  Attending Physician Name and Address:  Hillary Bow, MD  Relative Name and Phone Number:       Current Level of Care: Hospital Recommended Level of Care: [place] Prior Approval Number:    Date Approved/Denied:   PASRR Number: [id number]  Discharge Plan: SNF    Current Diagnoses: Patient Active Problem List   Diagnosis Date Noted  . Pressure injury of skin 09/25/2019  . Hip fracture ([place]) 09/24/2019  . Essential hypertension 11/29/2017  . Anxiety state 09/17/2017  . 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 Loss of memory 06/18/2017  . Situational anxiety 06/18/2017  . Sleep disturbance 06/18/2017  . Hypokalemia 12/08/2016  . Other insomnia 10/04/2016  . Hyperthyroidism 06/19/2016  . Multiple thyroid nodules 06/19/2016  . Gastroesophageal reflux disease without esophagitis 05/11/2016  . Mixed hyperlipidemia 04/06/2016  . Adrenal incidentaloma ([place]) 06/19/2013  . Carpal tunnel syndrome on both sides 06/19/2013  . COPD (chronic obstructive pulmonary disease) ([place]) 06/19/2013  . 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 Tobacco abuse 06/19/2013    Orientation RESPIRATION BLADDER Height & Weight     Self  Normal Continent Weight: 160 lb (72.6 kg) Height:  5\' 4"  (162.6 cm)  BEHAVIORAL SYMPTOMS/MOOD NEUROLOGICAL BOWEL NUTRITION STATUS      Continent Diet(Diet: NPO for surgery to be advanced.)  AMBULATORY STATUS COMMUNICATION OF NEEDS Skin   Extensive Assist Verbally Surgical wounds                       Personal Care Assistance Level  of Assistance  Bathing, Feeding, Dressing Bathing Assistance: Limited assistance Feeding assistance: Independent Dressing Assistance: Limited assistance     Functional Limitations Info  Sight, Hearing, Speech Sight Info: Adequate Hearing Info: Adequate Speech Info: Adequate    SPECIAL CARE FACTORS FREQUENCY  PT (By licensed PT), OT (By licensed OT)     PT Frequency: 5 OT Frequency: 5            Contractures      Additional Factors Info  Code Status, Allergies Code Status Info: Full Code. Allergies Info: not listed.           Current Medications (09/25/2019):  This is the current hospital active medication list Current Facility-Administered Medications  Medication Dose Route Frequency Provider Last Rate Last Dose  . 0.9 %  sodium chloride infusion   Intravenous Continuous Loletha Grayer, MD 50 mL/hr at 09/25/19 0326    . acetaminophen (TYLENOL) tablet 650 mg  650 mg Oral Q6H PRN Loletha Grayer, MD   650 mg at 09/24/19 2303   Or  . acetaminophen (TYLENOL) suppository 650 mg  650 mg Rectal Q6H PRN Loletha Grayer, MD      . buPROPion (WELLBUTRIN XL) 24 hr tablet 150 mg  150 mg Oral Daily Wieting, Richard, MD      . ceFAZolin (ANCEF) IVPB 2g/100 mL premix  2 g Intravenous 30 min Pre-Op Lovell Sheehan, MD      . Chlorhexidine Gluconate Cloth 2 % PADS 6 each  6 each Topical Daily Lovell Sheehan, MD      .  donepezil (ARICEPT) tablet 10 mg  10 mg Oral QHS Alford Highland, MD   10 mg at 09/24/19 2043  . enalaprilat (VASOTEC) injection 0.625 mg  0.625 mg Intravenous Q6H PRN Sudini, Wardell Heath, MD      . fluticasone (FLONASE) 50 MCG/ACT nasal spray 1 spray  1 spray Each Nare Daily Wieting, Richard, MD      . losartan (COZAAR) tablet 50 mg  50 mg Oral Daily Wieting, Richard, MD      . memantine [facility]) tablet 10 mg  10 mg Oral BID Alford Highland, MD   10 mg at 09/24/19 2043  . morphine 2 MG/ML injection 1 mg  1 mg Intravenous Q4H PRN Alford Highland, MD   1 mg at  09/25/19 0900  . multivitamin with minerals tablet 1 tablet  1 tablet Oral Daily Wieting, Richard, MD      . ondansetron [facility]) tablet 4 mg  4 mg Oral Q6H PRN Alford Highland, MD       Or  . ondansetron (ZOFRAN) injection 4 mg  4 mg Intravenous Q6H PRN Wieting, Richard, MD      . oxyCODONE (Oxy IR/ROXICODONE) immediate release tablet 5 mg  5 mg Oral Q4H PRN Wieting, Richard, MD      . oxymetazoline (AFRIN) 0.05 % nasal spray 1 spray  1 spray Each Nare BID PRN Sudini, Srikar, MD      . potassium chloride SA (KLOR-CON) CR tablet 40 mEq  40 mEq Oral BID Milagros Loll, MD   40 mEq at 09/24/19 2043  . QUEtiapine (SEROQUEL) tablet 50 mg  50 mg Oral QHS Alford Highland, MD   50 mg at 09/24/19 2043  . simvastatin (ZOCOR) tablet 20 mg  20 mg Oral q1800 Alford Highland, MD   20 mg at 09/24/19 1854  . tranexamic acid (CYKLOKAPRON) IVPB 1,000 mg  1,000 mg Intravenous To OR Lyndle Herrlich, MD         Discharge Medications: Please see discharge summary for a list of discharge medications.  Relevant Imaging Results:  Relevant Lab Results:   Additional Information SSN: [id number]  Damary Doland, Darleen Crocker, LCSW

## 2019-09-25 NOTE — Care Management Important Message (Signed)
Important Message  Patient Details  Name: Erica Walker MRN: [id number] Date of Birth: October 20, 1946   Medicare Important Message Given:  Yes  Initial Medicare IM given by Patient Access Associate on 09/25/2019 at 9:34am.   Dannette Barbara 09/25/2019, 3:03 PM

## 2019-09-26 ENCOUNTER — Other Ambulatory Visit: Payer: Self-pay | Admitting: Urology

## 2019-09-26 ENCOUNTER — Encounter: Payer: Self-pay | Admitting: Orthopedic Surgery

## 2019-09-26 LAB — CBC WITH DIFFERENTIAL/PLATELET
Abs Immature Granulocytes: 0.03 10*3/uL (ref 0.00–0.07)
Basophils Absolute: 0 10*3/uL (ref 0.0–0.1)
Basophils Relative: 0 %
Eosinophils Absolute: 0 10*3/uL (ref 0.0–0.5)
Eosinophils Relative: 0 %
HCT: 35 % — ABNORMAL LOW (ref 36.0–46.0)
Hemoglobin: 11.8 g/dL — ABNORMAL LOW (ref 12.0–15.0)
Immature Granulocytes: 0 %
Lymphocytes Relative: 12 %
Lymphs Abs: 1 10*3/uL (ref 0.7–4.0)
MCH: 30.1 pg (ref 26.0–34.0)
MCHC: 33.7 g/dL (ref 30.0–36.0)
MCV: 89.3 fL (ref 80.0–100.0)
Monocytes Absolute: 0.7 10*3/uL (ref 0.1–1.0)
Monocytes Relative: 9 %
Neutro Abs: 6.5 10*3/uL (ref 1.7–7.7)
Neutrophils Relative %: 79 %
Platelets: 133 10*3/uL — ABNORMAL LOW (ref 150–400)
RBC: 3.92 MIL/uL (ref 3.87–5.11)
RDW: 12.4 % (ref 11.5–15.5)
WBC: 8.3 10*3/uL (ref 4.0–10.5)
nRBC: 0 % (ref 0.0–0.2)

## 2019-09-26 LAB — BASIC METABOLIC PANEL
Anion gap: 10 (ref 5–15)
BUN: 8 mg/dL (ref 8–23)
CO2: 23 mmol/L (ref 22–32)
Calcium: 8.1 mg/dL — ABNORMAL LOW (ref 8.9–10.3)
Chloride: 103 mmol/L (ref 98–111)
Creatinine, Ser: 0.87 mg/dL (ref 0.44–1.00)
GFR calc Af Amer: >60 mL/min (ref >60)
GFR calc non Af Amer: >60 mL/min (ref >60)
Glucose, Bld: 154 mg/dL — ABNORMAL HIGH (ref 70–99)
Potassium: 4.3 mmol/L (ref 3.5–5.1)
Sodium: 136 mmol/L (ref 135–145)

## 2019-09-26 NOTE — Evaluation (Addendum)
Physical Therapy Evaluation Patient Details Name: Erica Walker MRN: [id number] DOB: May 26, 1946 Today's Date: 09/26/2019   History of Present Illness  Erica Walker is a 73 y.o. female with h/o HTN, depression, COPD. She presented to the hospital ED on 09/24/2019 complaining of L hip pain after slip and fall the previous day while walking at neigbor's house. Found to have L hip fx and underwent left Hip Anterior Hip Hemiarthroplasty on 09/25/2019.    Clinical Impression  Patient reports she has dementia and is forgetful and had difficulty remembering details of her prior level of function and home environment. She states she lives alone on the 6th floor of an assisted living facility with an elevator to enter but spends many nights with her neighbor across the hall who is older and she is afraid she will fall overnight. Prior to hospitalization, patient reports she was independent with household and community mobility without an assistive device. She reports independence with bathing (sponge bath) and dressing, but gave conflicting information about cleaning and meal prep. Upon physical therapy evaluation, patient requires CGA for most mobility due to safety concerns. She is at high risk for fall due to difficulty remembering how to safely use RW, altered gait pattern, and unsteadiness on feet during weight bearing activities and requires constant supervision/CGA when standing. She appears to have experienced a significant decrease in functional strength and independence and would benefit from short term rehab prior to returning home. Patient would benefit from physical therapy to address impairments and functional limitations (see below) to work towards stated goals and return to PLOF or maximal functional independence.      Follow Up Recommendations SNF;Supervision for mobility/OOB    Equipment Recommendations  Rolling walker with 5" wheels;3in1 (PT)    Recommendations for Other Services OT  consult     Precautions / Restrictions Precautions Precautions: Fall Restrictions Weight Bearing Restrictions: Yes LLE Weight Bearing: Weight bearing as tolerated      Mobility  Bed Mobility Overal bed mobility: Modified Independent Bed Mobility: Supine to Sit     Supine to sit: Modified independent (Device/Increase time);HOB elevated     General bed mobility comments: Pt required assistance to remove foam wedge.  Transfers Overall transfer level: Needs assistance Equipment used: Rolling walker (2 wheeled) Transfers: Sit to/from Stand Sit to Stand: Min guard;Supervision         General transfer comment: Patient completed si <> stand transfer bed to chair, chair <> chair x 3 with SBA - CGA with cuing for hand placement and RW handling. completed sit<> stand to/from standard toilet with one UE on handrail and RW and CGA for safety.  Ambulation/Gait Ambulation/Gait assistance: Min guard Gait Distance (Feet): 200 Feet Assistive device: Rolling walker (2 wheeled) Gait Pattern/deviations: Step-through pattern;Decreased step length - right;Decreased stride length;Decreased stance time - left;Trunk flexed Gait velocity: slow   General Gait Details: Patient ambulated around room furnature to get to the bathroom and back with CGA and RW. Demo poor use of RW and allowed it to get too far in front of self for stability. Also demo antalgic gait favoring L LE. Improved with cuing. Ambulated around nurses station in hall with [place] and RW. Noted for improved AD handling and gait with less cuing. Forgetful and mildly confused by hallways. requires assistance to find room.  Stairs            Wheelchair Mobility    Modified Rankin (Stroke Patients Only)       Balance Overall balance assessment:  Needs assistance Sitting-balance support: No upper extremity supported;Feet supported Sitting balance-Leahy Scale: Good Sitting balance - Comments: steady sitting at EOB   Standing  balance support: Bilateral upper extremity supported;During functional activity Standing balance-Leahy Scale: Fair Standing balance comment: Patient dependent on BUE support on RW to maintain balance while ambulating                             Pertinent Vitals/Pain Pain Assessment: Faces Faces Pain Scale: Hurts a little bit Pain Location: left hip Pain Descriptors / Indicators: Aching Pain Intervention(s): Limited activity within patient's tolerance;Monitored during session;Repositioned    Home Living Family/patient expects to be discharged to:: Private residence Living Arrangements: Alone Available Help at Discharge: Other (Comment)(lives in assisted living facility, states she can get some help) Type of Home: Assisted living Home Access: Elevator       Home Equipment: None Additional Comments: Patient reports she has dementia and has difficulty recalling details about her home. States she lives on the 6th floor of assisted living but stays the night a lot with her neighbor across the hall due to fear of her falling.    Prior Function Level of Independence: Needs assistance   Gait / Transfers Assistance Needed: Independent without AD for household and community ambulation.  ADL's / Homemaking Assistance Needed: She states she was independent with sponge baths, dressing. Mixed responses whether she was independent with meal prep or cleaning. States she doesn't drive and occastionally gets help with housework.  Comments: Patient reports she has dementia and has difficulty recalling details of her home and function.     Hand Dominance        Extremity/Trunk Assessment   Upper Extremity Assessment Upper Extremity Assessment: Overall WFL for tasks assessed    Lower Extremity Assessment Lower Extremity Assessment: LLE deficits/detail;RLE deficits/detail RLE Deficits / Details: WFL for tasks assessed LLE Deficits / Details: limited strength, pain, and extension  ROM at L hip affects gait. Able to perform active SLR in bed.    Cervical / Trunk Assessment Cervical / Trunk Assessment: Normal  Communication   Communication: No difficulties  Cognition Arousal/Alertness: Awake/alert Behavior During Therapy: WFL for tasks assessed/performed Overall Cognitive Status: History of cognitive impairments - at baseline                                 General Comments: Patient states she has dementia. Oriented to self. knows it is autumn. Unable to identify month or president. Reports she fell but cannot recall details of fall. Knows she had surgery. She is uncertain about some of the details of her PLOF or living environment.      General Comments      Exercises Other Exercises Other Exercises: Practiced room navigation with proper RW use, sit <> stand from standard commode, standing to wash hands, opening and closing doors while walking with RW. CGA for safety. Required cuing for improved AD handling and body position.   Assessment/Plan    PT Assessment Patient needs continued PT services  PT Problem List Decreased strength;Decreased mobility;Decreased safety awareness;Decreased range of motion;Decreased coordination;Decreased knowledge of precautions;Decreased activity tolerance;Decreased skin integrity;Decreased balance;Decreased knowledge of use of DME;Pain       PT Treatment Interventions DME instruction;Therapeutic activities;Gait training;Therapeutic exercise;Patient/family education;Balance training;Functional mobility training;Neuromuscular re-education    PT Goals (Current goals can be found in the Care Plan section)  Acute Rehab  PT Goals Patient Stated Goal: get back to being her active self PT Goal Formulation: With patient Time For Goal Achievement: 10/10/19 Potential to Achieve Goals: Good    Frequency BID   Barriers to discharge Decreased caregiver support patient lives alone, high fall risk, requires cuing and  supervision for all mobility at this point to prevent falls    Co-evaluation               AM-PAC PT "6 Clicks" Mobility  Outcome Measure Help needed turning from your back to your side while in a flat bed without using bedrails?: A Little Help needed moving from lying on your back to sitting on the side of a flat bed without using bedrails?: A Little Help needed moving to and from a bed to a chair (including a wheelchair)?: A Little Help needed standing up from a chair using your arms (e.g., wheelchair or bedside chair)?: A Little Help needed to walk in hospital room?: A Little Help needed climbing 3-5 steps with a railing? : A Lot 6 Click Score: 17    End of Session Equipment Utilized During Treatment: Gait belt Activity Tolerance: Patient tolerated treatment well Patient left: in chair;with call bell/phone within reach;with chair alarm set;with nursing/sitter in room Nurse Communication: Mobility status;Weight bearing status PT Visit Diagnosis: Unsteadiness on feet (R26.81);Muscle weakness (generalized) (M62.81);History of falling (Z91.81);Difficulty in walking, not elsewhere classified (R26.2)    Time: 1540-0867 PT Time Calculation (min) (ACUTE ONLY): 45 min   Charges:   PT Evaluation $PT Eval Low Complexity: 1 Low PT Treatments $Gait Training: 8-22 mins $Therapeutic Activity: 8-22 mins        Luretha Murphy. Ilsa Iha, PT, DPT 09/26/19, 1:37 PM

## 2019-09-26 NOTE — Progress Notes (Signed)
Physical Therapy Treatment Patient Details Name: Erica Walker MRN: [id number] DOB: 04/27/1946 Today's Date: 09/26/2019    History of Present Illness Erica Walker is a 73 y.o. female with h/o HTN, depression, COPD. She presented to the hospital ED on 09/24/2019 complaining of L hip pain after slip and fall the previous day while walking at neigbor's house. Found to have L hip fx and underwent left Hip Anterior Hip Hemiarthroplasty on 09/25/2019.    PT Comments    Patient tolerated treatment well and is making good progress towards goals at this point. Patient remains mildly confused and requires step by step cuing and CGA during functional mobility to maintain safety and prevent falls. Patient ambulated around nurses station with RW, CGA for safety, and frequent multimodal cuing to improve posture, step length, and AD handling. Patient became confused with cuing to improve posture at first and had difficulty focusing on ambulating while maintaining upright posture and body close to RW. This improved with cuing and practice. Auditory cadence cues were used to improve stride length and evenness of gait. Without cuing patient ambulates in stooped position with RW pushed too far forward for safety and uneven weight bearing greatly increasing risk for falls. Patient would benefit from continued physical therapy to address remaining impairments and functional limitations to work towards stated goals and return to PLOF or maximal functional independence.    Follow Up Recommendations  SNF;Supervision for mobility/OOB     Equipment Recommendations  Rolling walker with 5" wheels;3in1 (PT)    Recommendations for Other Services OT consult     Precautions / Restrictions Precautions Precautions: Fall Restrictions Weight Bearing Restrictions: Yes LLE Weight Bearing: Weight bearing as tolerated    Mobility  Bed Mobility Overal bed mobility: Modified Independent Bed Mobility: Supine to Sit      Supine to sit: Modified independent (Device/Increase time);HOB elevated     General bed mobility comments: Patient already up on commode at start of session and did not return to bed.  Transfers Overall transfer level: Needs assistance Equipment used: Rolling walker (2 wheeled) Transfers: Sit to/from Stand Sit to Stand: Min guard         General transfer comment: Patient completed sit to stand from standard commode to chair with CGA and cuing for hand placement to prevent pulling walker down on self and falling backwards. Demo difficulty with problem solving to find safe way to get up without cuing.  Ambulation/Gait Ambulation/Gait assistance: Min guard Gait Distance (Feet): 200 Feet Assistive device: Rolling walker (2 wheeled) Gait Pattern/deviations: Step-through pattern;Decreased step length - right;Decreased stride length;Decreased stance time - left;Trunk flexed Gait velocity: slow but improving   General Gait Details: Patient ambulated around nurses station with RW, CGA for safety, and frequent multimodal cuing to improve posture, step length, and AD handling. Patient became confused with cuing to improve posture at first and had difficulty focusing on ambulating while maintaining upright posture and body close to RW. This improved with cuing and practice. Auditory cadence cues were used to improve stride length and evenness of gait. Without cuing patient ambulates in stooped position with RW pushed too far forward for safety and uneven weight bearing greatly increasing risk for falls.   Stairs             Wheelchair Mobility    Modified Rankin (Stroke Patients Only)       Balance Overall balance assessment: Needs assistance Sitting-balance support: No upper extremity supported;Feet supported Sitting balance-Leahy Scale: Good Sitting balance - Comments: steady  sitting at EOB   Standing balance support: Bilateral upper extremity supported;During functional  activity Standing balance-Leahy Scale: Fair Standing balance comment: Patient dependent on BUE support on RW to maintain balance while ambulating                            Cognition Arousal/Alertness: Awake/alert Behavior During Therapy: WFL for tasks assessed/performed Overall Cognitive Status: History of cognitive impairments - at baseline                                 General Comments: Patient mildly confused. Memory impaired. Very pleasant      Exercises Other Exercises Other Exercises: Reveiwed HEP handout and completed all exercises that could be performed seated in chair: ankle pumps x 15 each side, LAQ x 10 each side, seated marchin x 15 reps each side, attempted glute sets in sitting, but patient unable to figure out how to perform properly. May be easier for her in supine. Required assistance with counting, reading instructions, and following pictures to perform exercises effectively.    General Comments        Pertinent Vitals/Pain Pain Assessment: Faces Faces Pain Scale: Hurts a little bit Pain Location: left hip Pain Descriptors / Indicators: Aching Pain Intervention(s): Limited activity within patient's tolerance;Monitored during session;Repositioned    Home Living Family/patient expects to be discharged to:: Private residence Living Arrangements: Alone Available Help at Discharge: Other (Comment)(lives in assisted living facility, states she can get some help) Type of Home: Assisted living Home Access: Elevator     Home Equipment: None Additional Comments: Patient reports she has dementia and has difficulty recalling details about her home. States she lives on the 6th floor of assisted living but stays the night a lot with her neighbor across the hall due to fear of her falling.    Prior Function Level of Independence: Needs assistance  Gait / Transfers Assistance Needed: Independent without AD for household and community  ambulation. ADL's / Homemaking Assistance Needed: She states she was independent with sponge baths, dressing. Mixed responses whether she was independent with meal prep or cleaning. States she doesn't drive and occastionally gets help with housework. Comments: Patient reports she has dementia and has difficulty recalling details of her home and function.   PT Goals (current goals can now be found in the care plan section) Acute Rehab PT Goals Patient Stated Goal: get back to being her active self PT Goal Formulation: With patient Time For Goal Achievement: 10/10/19 Potential to Achieve Goals: Good Progress towards PT goals: Progressing toward goals    Frequency    BID      PT Plan Current plan remains appropriate    Co-evaluation              AM-PAC PT "6 Clicks" Mobility   Outcome Measure  Help needed turning from your back to your side while in a flat bed without using bedrails?: A Little Help needed moving from lying on your back to sitting on the side of a flat bed without using bedrails?: A Little Help needed moving to and from a bed to a chair (including a wheelchair)?: A Little Help needed standing up from a chair using your arms (e.g., wheelchair or bedside chair)?: A Little Help needed to walk in hospital room?: A Little Help needed climbing 3-5 steps with a railing? : A Lot 6 Click  Score: 17    End of Session Equipment Utilized During Treatment: Gait belt Activity Tolerance: Patient tolerated treatment well Patient left: in chair;with call bell/phone within reach;with chair alarm set Nurse Communication: Mobility status;Precautions PT Visit Diagnosis: Unsteadiness on feet (R26.81);Muscle weakness (generalized) (M62.81);History of falling (Z91.81);Difficulty in walking, not elsewhere classified (R26.2)     Time: 2836-6294 PT Time Calculation (min) (ACUTE ONLY): 23 min  Charges:  $Gait Training: 8-22 mins $Therapeutic Exercise: 8-22 mins $Therapeutic  Activity: 8-22 mins                     Luretha Murphy. Ilsa Iha, PT, DPT 09/26/19, 3:11 PM

## 2019-09-26 NOTE — Progress Notes (Signed)
Erica Walker NAME: Erica Walker    MR#:  [id number]  DATE OF BIRTH:  [birth year]  SUBJECTIVE:  CHIEF COMPLAINT:   Chief Complaint  Patient presents with  . Fall   Restless trying to get out of bed S/p Left Hip Anterior Hip Hemiarthroplasty  Postop day 1  REVIEW OF SYSTEMS:    Review of Systems  Unable to perform ROS: Dementia   DRUG ALLERGIES:   Allergies  Allergen Reactions  . Other Rash    Allergy to Bel-tab    VITALS:  Blood pressure (!) 158/77, pulse 73, temperature 97.9 F (36.6 C), temperature source Oral, resp. rate 19, height 5\' 4"  (1.626 m), weight 72.6 kg, SpO2 97 %.  PHYSICAL EXAMINATION:   Physical Exam  GENERAL:  73 y.o.-year-old patient lying in the bed with no acute distress.  EYES: Pupils equal, round, reactive to light and accommodation. No scleral icterus. Extraocular muscles intact.  HEENT: Head atraumatic, normocephalic. Oropharynx and nasopharynx clear.  NECK:  Supple, no jugular venous distention. No thyroid enlargement, no tenderness.  LUNGS: Normal breath sounds bilaterally, no wheezing, rales, rhonchi. No use of accessory muscles of respiration.  CARDIOVASCULAR: S1, S2 normal. No murmurs, rubs, or gallops.  ABDOMEN: Soft, nontender, nondistended. Bowel sounds present. No organomegaly or mass.  EXTREMITIES: No cyanosis, clubbing or edema b/l.   Surgical dressing in place NEUROLOGIC: Cranial nerves II through XII are intact. No focal Motor or sensory deficits b/l.   PSYCHIATRIC: The patient is alert and awake SKIN: No obvious rash, lesion, or ulcer.   LABORATORY PANEL:   CBC Recent Labs  Lab 09/26/19 0547  WBC 8.3  HGB 11.8*  HCT 35.0*  PLT 133*   ------------------------------------------------------------------------------------------------------------------ Chemistries  Recent Labs  Lab 09/24/19 1256  09/25/19 0636 09/26/19 0547  NA 139  --  142 136  K 2.8*  --  3.6 4.3  CL  101  --  103 103  CO2 26  --  31 23  GLUCOSE 113*  --  113* 154*  BUN 8  --  8 8  CREATININE 0.84  --  0.91 0.87  CALCIUM 8.6*  --  8.7* 8.1*  MG  --    < > 2.3  --   AST 26  --   --   --   ALT 21  --   --   --   ALKPHOS 74  --   --   --   BILITOT 1.8*  --   --   --    < > = values in this interval not displayed.   ------------------------------------------------------------------------------------------------------------------  Cardiac Enzymes No results for input(s): TROPONINI in the last 168 hours. ------------------------------------------------------------------------------------------------------------------  RADIOLOGY:  Dg Chest 1 View  Result Date: 09/24/2019 CLINICAL DATA:  Fall yesterday on left hip EXAM: CHEST  1 VIEW COMPARISON:  None. FINDINGS: Normal heart size. Normal mediastinal contour. No pneumothorax. No pleural effusion. Lungs appear clear, with no acute consolidative airspace disease and no pulmonary edema. IMPRESSION: No active disease. Electronically Signed   By: Erica Walker M.D.   On: 09/24/2019 12:33   Ct Head Wo Contrast  Result Date: 09/24/2019 CLINICAL DATA:  Status post fall yesterday.  Initial encounter. EXAM: CT HEAD WITHOUT CONTRAST TECHNIQUE: Contiguous axial images were obtained from the base of the skull through the vertex without intravenous contrast. COMPARISON:  None. FINDINGS: Brain: No evidence of acute infarction, hemorrhage, hydrocephalus, extra-axial collection or mass lesion/mass effect. Atrophy  and chronic microvascular ischemic change noted. Vascular: No hyperdense vessel or unexpected calcification. Skull: Intact.  No focal lesion. Sinuses/Orbits: Negative. Other: None. IMPRESSION: No acute abnormality. Atrophy and chronic microvascular ischemic change. Electronically Signed   By: Erica Walker M.D.   On: 09/24/2019 14:02   Dg Hip Operative Unilat W Or W/o Pelvis Left  Result Date: 09/25/2019 CLINICAL DATA:  Intraoperative  localization for left total hip replacement. EXAM: OPERATIVE LEFT HIP (WITH PELVIS IF PERFORMED) 1 VIEWS TECHNIQUE: Fluoroscopic spot image(s) were submitted for interpretation post-operatively. COMPARISON:  Prior radiograph from 09/24/2019. FINDINGS: Single intraoperative fluoroscopic view of the left hip demonstrates placement of a left total hip arthroplasty. Visualized hardware appears well position without adverse features. Fluoroscopy time equals 9 seconds. Dose = 0.5 mGy. IMPRESSION: Intraoperative fluoroscopic localization for left total hip arthroplasty. Electronically Signed   By: Erica Walker M.D.   On: 09/25/2019 17:22   Dg Hip Unilat With Pelvis 2-3 Views Left  Result Date: 09/24/2019 CLINICAL DATA:  Left hip pain secondary to a fall yesterday. EXAM: DG HIP (WITH OR WITHOUT PELVIS) 2-3V LEFT COMPARISON:  None. FINDINGS: There is an impacted subcapital fracture left femoral neck with slight angulation. No dislocation. The pelvic bones are intact. IMPRESSION: Impacted subcapital fracture of the left femoral neck with slight angulation. Electronically Signed   By: Erica Walker M.D.   On: 09/24/2019 12:32     ASSESSMENT AND PLAN:   * left hip fracture Left Hip Anterior Hip Hemiarthroplasty  POD-1 Pain medications as needed Physical therapy evaluation DVT prophylaxis per orthopedic team SNF admission at discharge  *Mild asymptomatic sinus bradycardia Stable  *Hypokalemia.  Replaced and normalized  * Hypertension continue losartan and hold atenolol.  *  Dementia continue psychiatric medications.   Seroquel nightly  *  Hyperlipidemia on Zocor  *  Depression anxiety on Wellbutrin  All the records are reviewed and case discussed with Care Management/Social Workerr Management plans discussed with the patient, family and they are in agreement.  CODE STATUS: FULL CODE  DVT Prophylaxis: Lovenox  TOTAL TIME TAKING CARE OF THIS PATIENT: 35 minutes.   POSSIBLE D/C IN  2-3 DAYS, DEPENDING ON CLINICAL CONDITION.  Erica Walker M.D on 09/26/2019 at 10:24 AM  Between 7am to 6pm - Pager - [phone]  After 6pm go to [email or web address] - password [email or web address] [facility]  [facility]  Office  [phone]  CC: Primary care physician; Sharilyn Sites, MD  Note: This dictation was prepared with Dragon dictation along with smaller phrase technology. Any transcriptional errors that result from this process are unintentional.

## 2019-09-26 NOTE — Anesthesia Postprocedure Evaluation (Signed)
Anesthesia Post Note  Patient: Jovie Swanner  Procedure(s) Performed: ARTHROPLASTY BIPOLAR HIP (HEMIARTHROPLASTY) (Left Hip)  Patient location during evaluation: Nursing Unit Anesthesia Type: Spinal Level of consciousness: awake, awake and alert and patient cooperative Pain management: pain level controlled Respiratory status: spontaneous breathing, nonlabored ventilation and respiratory function stable Cardiovascular status: stable Postop Assessment: no headache, no backache, no apparent nausea or vomiting, patient able to bend at knees, able to ambulate and adequate PO intake Anesthetic complications: no     Last Vitals:  Vitals:   09/25/19 2351 09/26/19 0029  BP: 134/78 (!) 147/60  Pulse: 94 92  Resp: 18 19  Temp: 36.7 C 36.8 C  SpO2: 99% 96%    Last Pain:  Vitals:   09/26/19 0454  TempSrc:   PainSc: Asleep                 Pleasant Britz,  Baird Cancer

## 2019-09-26 NOTE — TOC Progression Note (Signed)
Transition of Care [facility]) - Progression Note    Patient Details  Name: Erica Walker MRN: [id number] Date of Birth: 1946-03-02  Transition of Care [facility]) CM/SW Contact  [place] Hafen, Lenice Llamas Phone Number: [phone]  09/26/2019, 12:43 PM  Clinical Narrative: Plan is for patient to D/C to [facility] in [place] room B-5 over the weekend if medically stable. Per [facility] admissions coordinator at [facility] patient can come over the weekend.    Expected Discharge Plan: [place] Barriers to Discharge: Continued Medical Work up  Expected Discharge Plan and Services Expected Discharge Plan: [place] In-house Referral: Clinical Social Work   Post Acute Care Choice: [place] Living arrangements for the past 2 months: Apartment                                       Social Determinants of Health (SDOH) Interventions    Readmission Risk Interventions No flowsheet data found.

## 2019-09-26 NOTE — Progress Notes (Signed)
Subjective:  Patient reports pain as mild.    Objective:   VITALS:   Vitals:   09/25/19 2123 09/25/19 2258 09/25/19 2351 09/26/19 0029  BP: (!) 157/77 125/73 134/78 (!) 147/60  Pulse: 95 97 94 92  Resp: 19 19 18 19   Temp: 98.2 F (36.8 C) 97.9 F (36.6 C) 98.1 F (36.7 C) 98.2 F (36.8 C)  TempSrc:   Oral   SpO2: 95% 97% 99% 96%  Weight:      Height:        PHYSICAL EXAM:  Neurologically intact ABD soft Neurovascular intact Sensation intact distally Intact pulses distally Dorsiflexion/Plantar flexion intact Incision: dressing C/D/I No cellulitis present Compartment soft  LABS  Results for orders placed or performed during the hospital encounter of 09/24/19 (from the past 24 hour(s))  Basic metabolic panel     Status: Abnormal   Collection Time: 09/26/19  5:47 AM  Result Value Ref Range   Sodium 136 135 - 145 mmol/L   Potassium 4.3 3.5 - 5.1 mmol/L   Chloride 103 98 - 111 mmol/L   CO2 23 22 - 32 mmol/L   Glucose, Bld 154 (H) 70 - 99 mg/dL   BUN 8 8 - 23 mg/dL   Creatinine, Ser 09/28/19 0.44 - 1.00 mg/dL   Calcium 8.1 (L) 8.9 - 10.3 mg/dL   GFR calc non Af Amer >60 >60 mL/min   GFR calc Af Amer >60 >60 mL/min   Anion gap 10 5 - 15    Dg Chest 1 View  Result Date: 09/24/2019 CLINICAL DATA:  Fall yesterday on left hip EXAM: CHEST  1 VIEW COMPARISON:  None. FINDINGS: Normal heart size. Normal mediastinal contour. No pneumothorax. No pleural effusion. Lungs appear clear, with no acute consolidative airspace disease and no pulmonary edema. IMPRESSION: No active disease. Electronically Signed   By: 09/26/2019 M.D.   On: 09/24/2019 12:33   Ct Head Wo Contrast  Result Date: 09/24/2019 CLINICAL DATA:  Status post fall yesterday.  Initial encounter. EXAM: CT HEAD WITHOUT CONTRAST TECHNIQUE: Contiguous axial images were obtained from the base of the skull through the vertex without intravenous contrast. COMPARISON:  None. FINDINGS: Brain: No evidence of acute  infarction, hemorrhage, hydrocephalus, extra-axial collection or mass lesion/mass effect. Atrophy and chronic microvascular ischemic change noted. Vascular: No hyperdense vessel or unexpected calcification. Skull: Intact.  No focal lesion. Sinuses/Orbits: Negative. Other: None. IMPRESSION: No acute abnormality. Atrophy and chronic microvascular ischemic change. Electronically Signed   By: 09/26/2019 M.D.   On: 09/24/2019 14:02   Dg Hip Operative Unilat W Or W/o Pelvis Left  Result Date: 09/25/2019 CLINICAL DATA:  Intraoperative localization for left total hip replacement. EXAM: OPERATIVE LEFT HIP (WITH PELVIS IF PERFORMED) 1 VIEWS TECHNIQUE: Fluoroscopic spot image(s) were submitted for interpretation post-operatively. COMPARISON:  Prior radiograph from 09/24/2019. FINDINGS: Single intraoperative fluoroscopic view of the left hip demonstrates placement of a left total hip arthroplasty. Visualized hardware appears well position without adverse features. Fluoroscopy time equals 9 seconds. Dose = 0.5 mGy. IMPRESSION: Intraoperative fluoroscopic localization for left total hip arthroplasty. Electronically Signed   By: 09/26/2019 M.D.   On: 09/25/2019 17:22   Dg Hip Unilat With Pelvis 2-3 Views Left  Result Date: 09/24/2019 CLINICAL DATA:  Left hip pain secondary to a fall yesterday. EXAM: DG HIP (WITH OR WITHOUT PELVIS) 2-3V LEFT COMPARISON:  None. FINDINGS: There is an impacted subcapital fracture left femoral neck with slight angulation. No dislocation. The pelvic bones are intact.  IMPRESSION: Impacted subcapital fracture of the left femoral neck with slight angulation. Electronically Signed   By: Lorriane Shire M.D.   On: 09/24/2019 12:32    Assessment/Plan: 1 Day Post-Op   Active Problems:   Hip fracture ([place])   Pressure injury of skin   Advance diet Up with therapy  Discharge per medicine F/U 10/09/2019 in office  Call for appt. [phone] Lovenox for 14 days post  op   Erica Walker , PA-C 09/26/2019, 7:38 AM

## 2019-09-27 LAB — CBC WITH DIFFERENTIAL/PLATELET
Abs Immature Granulocytes: 0.05 10*3/uL (ref 0.00–0.07)
Basophils Absolute: 0 10*3/uL (ref 0.0–0.1)
Basophils Relative: 0 %
Eosinophils Absolute: 0.2 10*3/uL (ref 0.0–0.5)
Eosinophils Relative: 2 %
HCT: 33.4 % — ABNORMAL LOW (ref 36.0–46.0)
Hemoglobin: 11.4 g/dL — ABNORMAL LOW (ref 12.0–15.0)
Immature Granulocytes: 1 %
Lymphocytes Relative: 13 %
Lymphs Abs: 1.1 10*3/uL (ref 0.7–4.0)
MCH: 30.3 pg (ref 26.0–34.0)
MCHC: 34.1 g/dL (ref 30.0–36.0)
MCV: 88.8 fL (ref 80.0–100.0)
Monocytes Absolute: 0.8 10*3/uL (ref 0.1–1.0)
Monocytes Relative: 10 %
Neutro Abs: 6.1 10*3/uL (ref 1.7–7.7)
Neutrophils Relative %: 74 %
Platelets: 149 10*3/uL — ABNORMAL LOW (ref 150–400)
RBC: 3.76 MIL/uL — ABNORMAL LOW (ref 3.87–5.11)
RDW: 12.5 % (ref 11.5–15.5)
WBC: 8.2 10*3/uL (ref 4.0–10.5)
nRBC: 0 % (ref 0.0–0.2)

## 2019-09-27 NOTE — Progress Notes (Signed)
  Subjective:  POD #2 s/p left hip hemiarthroplasty.   Patient reports left hip pain as mild.    Objective:   VITALS:   Vitals:   09/26/19 1559 09/26/19 2320 09/27/19 0741 09/27/19 1622  BP: 135/65 (!) 165/79 (!) 160/78 (!) 165/77  Pulse: 89 98 (!) 111 80  Resp:  16  16  Temp: 98 F (36.7 C) 99.6 F (37.6 C) 98.7 F (37.1 C) 98.4 F (36.9 C)  TempSrc: Oral Oral    SpO2: 97% 96% 99% 99%  Weight:      Height:        PHYSICAL EXAM: Left lower extremity Neurovascular intact Sensation intact distally Intact pulses distally Dorsiflexion/Plantar flexion intact Incision: dressing C/D/I No cellulitis present Compartment soft  LABS  Results for orders placed or performed during the hospital encounter of 09/24/19 (from the past 24 hour(s))  CBC with Differential/Platelet     Status: Abnormal   Collection Time: 09/27/19  4:29 AM  Result Value Ref Range   WBC 8.2 4.0 - 10.5 K/uL   RBC 3.76 (L) 3.87 - 5.11 MIL/uL   Hemoglobin 11.4 (L) 12.0 - 15.0 g/dL   HCT 33.4 (L) 36.0 - 46.0 %   MCV 88.8 80.0 - 100.0 fL   MCH 30.3 26.0 - 34.0 pg   MCHC 34.1 30.0 - 36.0 g/dL   RDW 12.5 11.5 - 15.5 %   Platelets 149 (L) 150 - 400 K/uL   nRBC 0.0 0.0 - 0.2 %   Neutrophils Relative % 74 %   Neutro Abs 6.1 1.7 - 7.7 K/uL   Lymphocytes Relative 13 %   Lymphs Abs 1.1 0.7 - 4.0 K/uL   Monocytes Relative 10 %   Monocytes Absolute 0.8 0.1 - 1.0 K/uL   Eosinophils Relative 2 %   Eosinophils Absolute 0.2 0.0 - 0.5 K/uL   Basophils Relative 0 %   Basophils Absolute 0.0 0.0 - 0.1 K/uL   Immature Granulocytes 1 %   Abs Immature Granulocytes 0.05 0.00 - 0.07 K/uL    Dg Hip Operative Unilat W Or W/o Pelvis Left  Result Date: 09/25/2019 CLINICAL DATA:  Intraoperative localization for left total hip replacement. EXAM: OPERATIVE LEFT HIP (WITH PELVIS IF PERFORMED) 1 VIEWS TECHNIQUE: Fluoroscopic spot image(s) were submitted for interpretation post-operatively. COMPARISON:  Prior radiograph from  09/24/2019. FINDINGS: Single intraoperative fluoroscopic view of the left hip demonstrates placement of a left total hip arthroplasty. Visualized hardware appears well position without adverse features. Fluoroscopy time equals 9 seconds. Dose = 0.5 mGy. IMPRESSION: Intraoperative fluoroscopic localization for left total hip arthroplasty. Electronically Signed   By: Jeannine Boga M.D.   On: 09/25/2019 17:22    Assessment/Plan: 2 Days Post-Op   Active Problems:   Hip fracture ([facility])   Pressure injury of skin  Patient stable and making progress post-op.  Continue PT.  Continue lovenox.    Th[place]ton Park , MD 09/27/2019, 4:48 PM

## 2019-09-27 NOTE — Progress Notes (Signed)
Physical Therapy Treatment Patient Details Name: Erica Walker MRN: [id number] DOB: Oct 06, 1946 Today's Date: 09/27/2019    History of Present Illness Erica Walker is a 73 y.o. female with h/o HTN, depression, COPD. She presented to the hospital ED on 09/24/2019 complaining of L hip pain after slip and fall the previous day while walking at neigbor's house. Found to have L hip fx and underwent left Hip Anterior Hip Hemiarthroplasty on 09/25/2019.    PT Comments    Pt able to demonstrate some progress with independence of there ex today, open to all education though unsure of how much she is able to carryover.  Pt ambulated with VC's for proper management of RW and upright posture.  Gait pattern improved today with step through gait pattern.  Pt ambulated 25 ft and focus of treatment was on there ex and STS mechanics after pt tried to sit down without UE's.  Pt able to demonstrate min improvement for safe STS after education.  She required CGA for STS and ambulation due to mild instability but no LOB's experienced.  Pt requires redirection during mobility and does not always engage in conversation during education, but oriented to self and situation.  She did not give a numerical pain rating but stated that her pain was "not too bad" while rubbing over L hip.  She will continue to benefit from skilled PT with focus on safe functional mobility, pain management and strength.  Follow Up Recommendations  SNF;Supervision for mobility/OOB     Equipment Recommendations  Rolling walker with 5" wheels;3in1 (PT)    Recommendations for Other Services OT consult     Precautions / Restrictions Precautions Precautions: Fall Restrictions Weight Bearing Restrictions: Yes LLE Weight Bearing: Weight bearing as tolerated    Mobility  Bed Mobility Overal bed mobility: Modified Independent             General bed mobility comments: Increased time  Transfers Overall transfer level: Needs  assistance Equipment used: Rolling walker (2 wheeled)   Sit to Stand: From elevated surface         General transfer comment: VC's for hand placement and close CGA due to some unsteadiness.  Pt able to rise from bedside with UE assistance only.  Ambulation/Gait Ambulation/Gait assistance: Min guard Gait Distance (Feet): 75 Feet Assistive device: Rolling walker (2 wheeled) Gait Pattern/deviations: Step-through pattern;Decreased step length - right;Decreased stride length;Decreased stance time - left;Trunk flexed   Gait velocity interpretation: 1.31 - 2.62 ft/sec, indicative of limited community ambulator General Gait Details: Pt able to manage RW with min VC's for placement and upright posture.  Pt may benefit from a youth size walker.  Pt did not show signs of antalgic gait when ambulating.   Stairs             Wheelchair Mobility    Modified Rankin (Stroke Patients Only)       Balance Overall balance assessment: Needs assistance Sitting-balance support: Feet supported Sitting balance-Leahy Scale: Normal     Standing balance support: Bilateral upper extremity supported Standing balance-Leahy Scale: Fair Standing balance comment: Patient dependent on BUE support on RW to maintain balance while ambulating                            Cognition Arousal/Alertness: Awake/alert Behavior During Therapy: WFL for tasks assessed/performed Overall Cognitive Status: History of cognitive impairments - at baseline  General Comments: Patient mildly confused. Memory impaired. Very pleasant.  Able to follow directions during ther ex but does not engage in conversation during education.      Exercises Total Joint Exercises Ankle Circles/Pumps: 20 reps;Both;Seated Quad Sets: Both;10 reps;Seated(Manual cues and assistance with counting.) Towel Squeeze: Both;10 reps;Seated;Strengthening Hip ABduction/ADduction:  Strengthening;Both;10 reps;Seated Long Arc Quad: Strengthening;Both;10 reps;Seated Marching in Standing: (Attempted seated marching but unable to lift L LE.) Other Exercises Other Exercises: Reviewed HEP and educated regarding importance of frequent ther ex. x4 min Other Exercises: STS mechanics for safety x5 with CGA and VC's for body mechanics and hand placement.    General Comments        Pertinent Vitals/Pain Faces Pain Scale: Hurts a little bit Pain Location: Rubs hand over L hip at surgical site and says "it doesn't hurt too much; just here". Pain Intervention(s): Limited activity within patient's tolerance;Monitored during session    Home Living                      Prior Function            PT Goals (current goals can now be found in the care plan section) Acute Rehab PT Goals Patient Stated Goal: get back to being her active self PT Goal Formulation: With patient Time For Goal Achievement: 10/10/19 Potential to Achieve Goals: Good Progress towards PT goals: Progressing toward goals    Frequency    BID      PT Plan Current plan remains appropriate    Co-evaluation              AM-PAC PT "6 Clicks" Mobility   Outcome Measure  Help needed turning from your back to your side while in a flat bed without using bedrails?: A Little Help needed moving from lying on your back to sitting on the side of a flat bed without using bedrails?: A Little Help needed moving to and from a bed to a chair (including a wheelchair)?: A Little Help needed standing up from a chair using your arms (e.g., wheelchair or bedside chair)?: A Little Help needed to walk in hospital room?: A Little Help needed climbing 3-5 steps with a railing? : A Lot 6 Click Score: 17    End of Session Equipment Utilized During Treatment: Gait belt Activity Tolerance: Patient tolerated treatment well Patient left: in chair;with call bell/phone within reach;with chair alarm set;with SCD's  reapplied Nurse Communication: Mobility status;Precautions PT Visit Diagnosis: Unsteadiness on feet (R26.81);Muscle weakness (generalized) (M62.81);History of falling (Z91.81);Difficulty in walking, not elsewhere classified (R26.2)     Time: 0240-9735 PT Time Calculation (min) (ACUTE ONLY): 23 min  Charges:  $Therapeutic Exercise: 23-37 mins                     Glenetta Hew, PT, DPT    Glenetta Hew 09/27/2019, 10:44 AM

## 2019-09-27 NOTE — Progress Notes (Signed)
[facility] NAME: Erica Walker    MR#:  [id number]  DATE OF BIRTH:  09-16-1946  SUBJECTIVE:  CHIEF COMPLAINT:   Chief Complaint  Patient presents with  . Fall   Pleasantly confused S/p Left Hip Anterior Hip Hemiarthroplasty  Postop day 2  REVIEW OF SYSTEMS:    Review of Systems  Unable to perform ROS: Dementia   DRUG ALLERGIES:   Allergies  Allergen Reactions  . Other Rash    Allergy to Bel-tab    VITALS:  Blood pressure (!) 165/79, pulse 98, temperature 99.6 F (37.6 C), temperature source Oral, resp. rate 16, height 5\' 4"  (1.626 m), weight 72.6 kg, SpO2 96 %.  PHYSICAL EXAMINATION:   Physical Exam  GENERAL:  73 y.o.-year-old patient lying in the bed with no acute distress.  EYES: Pupils equal, round, reactive to light and accommodation. No scleral icterus. Extraocular muscles intact.  HEENT: Head atraumatic, normocephalic. Oropharynx and nasopharynx clear.  NECK:  Supple, no jugular venous distention. No thyroid enlargement, no tenderness.  LUNGS: Normal breath sounds bilaterally, no wheezing, rales, rhonchi. No use of accessory muscles of respiration.  CARDIOVASCULAR: S1, S2 normal. No murmurs, rubs, or gallops.  ABDOMEN: Soft, nontender, nondistended. Bowel sounds present. No organomegaly or mass.  EXTREMITIES: No cyanosis, clubbing or edema b/l.   Surgical dressing in place NEUROLOGIC: Cranial nerves II through XII are intact. No focal Motor or sensory deficits b/l.   PSYCHIATRIC: The patient is alert and awake SKIN: No obvious rash, lesion, or ulcer.   LABORATORY PANEL:   CBC Recent Labs  Lab 09/27/19 0429  WBC 8.2  HGB 11.4*  HCT 33.4*  PLT 149*   ------------------------------------------------------------------------------------------------------------------ Chemistries  Recent Labs  Lab 09/24/19 1256  09/25/19 0636 09/26/19 0547  NA 139  --  142 136  K 2.8*  --  3.6 4.3  CL 101  --  103  103  CO2 26  --  31 23  GLUCOSE 113*  --  113* 154*  BUN 8  --  8 8  CREATININE 0.84  --  0.91 0.87  CALCIUM 8.6*  --  8.7* 8.1*  MG  --    < > 2.3  --   AST 26  --   --   --   ALT 21  --   --   --   ALKPHOS 74  --   --   --   BILITOT 1.8*  --   --   --    < > = values in this interval not displayed.   ------------------------------------------------------------------------------------------------------------------  Cardiac Enzymes No results for input(s): TROPONINI in the last 168 hours. ------------------------------------------------------------------------------------------------------------------  RADIOLOGY:  Dg Hip Operative Unilat W Or W/o Pelvis Left  Result Date: 09/25/2019 CLINICAL DATA:  Intraoperative localization for left total hip replacement. EXAM: OPERATIVE LEFT HIP (WITH PELVIS IF PERFORMED) 1 VIEWS TECHNIQUE: Fluoroscopic spot image(s) were submitted for interpretation post-operatively. COMPARISON:  Prior radiograph from 09/24/2019. FINDINGS: Single intraoperative fluoroscopic view of the left hip demonstrates placement of a left total hip arthroplasty. Visualized hardware appears well position without adverse features. Fluoroscopy time equals 9 seconds. Dose = 0.5 mGy. IMPRESSION: Intraoperative fluoroscopic localization for left total hip arthroplasty. Electronically Signed   By: Jeannine Boga M.D.   On: 09/25/2019 17:22   ASSESSMENT AND PLAN:   * left hip fracture Left Hip Anterior Hip Hemiarthroplasty  POD-2 Pain medications as needed Physical therapy evaluation DVT prophylaxis per orthopedic  team SNF admission at discharge  *Mild asymptomatic sinus bradycardia Stable  *Hypokalemia.  Replaced and normalized  * Hypertension continue losartan and hold atenolol.  *  Dementia continue psychiatric medications.   Seroquel nightly Sundowning  *  Hyperlipidemia on Zocor  *  Depression anxiety on Wellbutrin  All the records are reviewed and case  discussed with Care Management/Social Workerr Management plans discussed with the patient, family and they are in agreement.  CODE STATUS: FULL CODE  DVT Prophylaxis: Lovenox  TOTAL TIME TAKING CARE OF THIS PATIENT: 35 minutes.   POSSIBLE D/C IN 1-2 DAYS, DEPENDING ON CLINICAL CONDITION.  Molinda Bailiff Everard Interrante M.D on 09/27/2019 at 11:59 AM  Between 7am to 6pm - Pager - [phone]  After 6pm go to [email or web address] - password [email or web address] [facility]  [facility]  Office  [phone]  CC: Primary care physician; Sharilyn Sites, MD  Note: This dictation was prepared with Dragon dictation along with smaller phrase technology. Any transcriptional errors that result from this process are unintentional.

## 2019-09-27 NOTE — Progress Notes (Signed)
PT Cancellation Note  Patient Details Name: Erica Walker MRN: [id number] DOB: 07-17-1946   Cancelled Treatment:    Reason Eval/Treat Not Completed: Patient declined, no reason specified.  Pt refused, stating that her visitor just went downstairs for a minute and would be right back up.  When asked if she would like to do chair exercises she stated that she would prefer to wait.  Roxanne Gates, PT, DPT  Roxanne Gates 09/27/2019, 2:39 PM

## 2019-09-28 DIAGNOSIS — S72002A Fracture of unspecified part of neck of left femur, initial encounter for closed fracture: Secondary | ICD-10-CM

## 2019-09-28 NOTE — Progress Notes (Signed)
  Subjective:  POD #3 s/p left hip hemiarthroplasty.   Patient reports left hip pain as mild to moderate.  Patient up out of bed to a chair.    Objective:   VITALS:   Vitals:   09/27/19 1622 09/27/19 2323 09/28/19 0723 09/28/19 1508  BP: (!) 165/77 (!) 141/69 (!) 145/70 (!) 147/72  Pulse: 80 97 94 94  Resp: 16 16 18 18   Temp: 98.4 F (36.9 C) 98.7 F (37.1 C) 98.6 F (37 C) 98.4 F (36.9 C)  TempSrc:  Oral Oral Oral  SpO2: 99% 95% 94% 96%  Weight:      Height:        PHYSICAL EXAM: Left lower extremity: Neurovascular intact Sensation intact distally Intact pulses distally Dorsiflexion/Plantar flexion intact Dressing: mild drainage No cellulitis present Compartment soft  LABS  No results found for this or any previous visit (from the past 24 hour(s)).  No results found.  Assessment/Plan: 3 Days Post-Op   Active Problems:   Hip fracture (HCC)   Pressure injury of skin  Patient remained stable postop.  Continue current pain management and physical therapy.  Patient awaiting skilled nursing facility placement.  Continue Lovenox for DVT prophylaxis.    Thornton Park , MD 09/28/2019, 3:27 PM

## 2019-09-28 NOTE — Progress Notes (Signed)
Physical Therapy Treatment Patient Details Name: Erica Walker MRN: [id number] DOB: [birth year] Today's Date: 09/28/2019    History of Present Illness Erica Walker is a 73 y.o. female with h/o HTN, depression, COPD. She presented to the hospital ED on 09/24/2019 complaining of L hip pain after slip and fall the previous day while walking at neigbor's house. Found to have L hip fx and underwent left Hip Anterior Hip Hemiarthroplasty on 09/25/2019.    PT Comments    Pt reporting lower pain level and able to ambulate 200 ft today with cues for safety and use of RW.  Pt more responsive during conversation but still with poor safety awareness.  She required min A for there ex setup and proper form.  Pt able to stand with min A to stabilize and with use of RW.  She required several cues for posture and RW placement during ambulation but demonstrated better symmetry of gait and full WB on L LE.  Pt will continue to benefit from skilled PT with focus on strength, safe functional mobility, pain management and balance.  Follow Up Recommendations  SNF;Supervision for mobility/OOB     Equipment Recommendations  Rolling walker with 5" wheels;3in1 (PT)    Recommendations for Other Services       Precautions / Restrictions Precautions Precautions: Fall Restrictions Weight Bearing Restrictions: Yes LLE Weight Bearing: Weight bearing as tolerated    Mobility  Bed Mobility Overal bed mobility: Modified Independent Bed Mobility: Sit to Supine     Supine to sit: Modified independent (Device/Increase time);HOB elevated     General bed mobility comments: Increased time  Transfers Overall transfer level: Needs assistance Equipment used: Rolling walker (2 wheeled) Transfers: Sit to/from Stand Sit to Stand: From elevated surface;Min guard         General transfer comment: Slow to stand at first and PT nearby due to pt being mildly unsteady, no LOB's though.  Uses RW to  stand.  Ambulation/Gait Ambulation/Gait assistance: Min guard Gait Distance (Feet): 200 Feet Assistive device: Rolling walker (2 wheeled) Gait Pattern/deviations: Step-through pattern;Decreased step length - right;Decreased stride length;Decreased stance time - left;Trunk flexed   Gait velocity interpretation: 1.31 - 2.62 ft/sec, indicative of limited community ambulator General Gait Details: Step through symmetric gait pattern without report of pain increase.  Frequent cues provided for safety, posture and proper use of RW.   Stairs             Wheelchair Mobility    Modified Rankin (Stroke Patients Only)       Balance Overall balance assessment: Needs assistance;History of Falls Sitting-balance support: Feet supported Sitting balance-Leahy Scale: Normal     Standing balance support: Bilateral upper extremity supported Standing balance-Leahy Scale: Fair Standing balance comment: Patient dependent on BUE support on RW to maintain balance while ambulating                            Cognition Arousal/Alertness: Awake/alert Behavior During Therapy: WFL for tasks assessed/performed Overall Cognitive Status: History of cognitive impairments - at baseline                                 General Comments: Pt still requires frequent cues for safety but more engaging in conversation today.      Exercises Total Joint Exercises Ankle Circles/Pumps: 20 reps;Both;Seated Quad Sets: Both;Seated;15 reps(Manual cues and assistance with counting.) Towel Squeeze:  Both;Seated;Strengthening;15 reps Hip ABduction/ADduction: Strengthening;Both;Seated;15 reps Long Arc Quad: Strengthening;Both;Seated;15 reps Marching in Standing: (Attempted seated marching but unable to lift L LE.)    General Comments        Pertinent Vitals/Pain Pain Assessment: Faces Faces Pain Scale: No hurt Pain Location: L hip; "I feel a lot better today!"    Home Living                       Prior Function            PT Goals (current goals can now be found in the care plan section) Acute Rehab PT Goals Patient Stated Goal: get back to being her active self PT Goal Formulation: With patient Time For Goal Achievement: 10/10/19 Potential to Achieve Goals: Good Progress towards PT goals: Progressing toward goals    Frequency    BID      PT Plan Current plan remains appropriate    Co-evaluation              AM-PAC PT "6 Clicks" Mobility   Outcome Measure  Help needed turning from your back to your side while in a flat bed without using bedrails?: None Help needed moving from lying on your back to sitting on the side of a flat bed without using bedrails?: A Little Help needed moving to and from a bed to a chair (including a wheelchair)?: A Little Help needed standing up from a chair using your arms (e.g., wheelchair or bedside chair)?: A Little Help needed to walk in hospital room?: A Little Help needed climbing 3-5 steps with a railing? : A Lot 6 Click Score: 18    End of Session Equipment Utilized During Treatment: Gait belt Activity Tolerance: Patient tolerated treatment well Patient left: with call bell/phone within reach;with SCD's reapplied;in bed;with bed alarm set Nurse Communication: (Nursing in to check on pt during treatment.) PT Visit Diagnosis: Unsteadiness on feet (R26.81);Muscle weakness (generalized) (M62.81);History of falling (Z91.81);Difficulty in walking, not elsewhere classified (R26.2)     Time: 1456-1510 PT Time Calculation (min) (ACUTE ONLY): 14 min  Charges:  $Therapeutic Exercise: 8-22 mins                     Glenetta Hew, PT, DPT    Glenetta Hew 09/28/2019, 4:27 PM

## 2019-09-28 NOTE — Progress Notes (Signed)
PT Cancellation Note  Patient Details Name: Erica Walker MRN: [id number] DOB: [birth year]   Cancelled Treatment:    Reason Eval/Treat Not Completed: Patient declined, no reason specified.  Pt currently eating lunch.  Will try back when time allows.  Roxanne Gates, PT, DPT  Roxanne Gates 09/28/2019, 2:20 PM

## 2019-09-28 NOTE — Progress Notes (Signed)
PROGRESS NOTE    Erica Walker  MPN:[id number] DOB: June 01, 1946 DOA: 09/24/2019 PCP: Marygrace Drought, MD   Brief Narrative: 73 year old lady with past medical history significant for hypertension, and dementia was admitted after having a fall resulted in left hip fracture.  She was treated with left hip hemiarthroplasty.  Assessment & Plan:   Active Problems:   Hip fracture (HCC)   Pressure injury of skin  Left hip fracture.  Postoperative day 3, s/p left hip hemiarthroplasty.  Patient is improving and working with physical therapist.  Waiting for SNF placement. -Continue current pain management. -Continue physical therapy.  Hypertension.  Mildly elevated blood pressure. Continue losartan.  Mild asymptomatic sinus bradycardia.  Resolved after holding home atenolol. Will monitor and can restart home atenolol at a lower dose.  Dementia.  Pleasant patient. -Continue home Seroquel. -Continue delirium precautions.  Hyperlipidemia.  Continue home Zocor  Depression/anxiety.  Continue home Wellbutrin  DVT prophylaxis: Lovenox Code Status: Full Family Communication: Discussed with patient Disposition Plan: Most likely be discharged tomorrow to SNF. Consultants:  Orthopedic.  Procedures: S/p left hip anterior hemiarthroplasty.  Subjective: Patient was feeling better when seen this morning.  Waiting for SNF placement. Able to ambulate.  Objective: Vitals:   09/27/19 1622 09/27/19 2323 09/28/19 0723 09/28/19 1508  BP: (!) 165/77 (!) 141/69 (!) 145/70 (!) 147/72  Pulse: 80 97 94 94  Resp: 16 16 18 18   Temp: 98.4 F (36.9 C) 98.7 F (37.1 C) 98.6 F (37 C) 98.4 F (36.9 C)  TempSrc:  Oral Oral Oral  SpO2: 99% 95% 94% 96%  Weight:      Height:        Intake/Output Summary (Last 24 hours) at 09/28/2019 1707 Last data filed at 09/28/2019 1124 Gross per 24 hour  Intake 1 ml  Output -  Net 1 ml   Filed Weights   09/24/19 1143  Weight: 72.6 kg    Examination:   General exam: Appears calm and comfortable  Respiratory system: Clear to auscultation. Respiratory effort normal. Cardiovascular system: S1 & S2 heard, RRR. No JVD, murmurs, rubs, gallops or clicks. No pedal edema. Gastrointestinal system: Abdomen is nondistended, soft and nontender. No organomegaly or masses felt. Normal bowel sounds heard. Central nervous system: Alert and oriented. No focal neurological deficits. Extremities: Left hip clean bandage. Skin: No rashes, lesions or ulcers Psychiatry: Judgement and insight appear normal. Mood & affect appropriate.   Data Reviewed: I have personally reviewed following labs and imaging studies  CBC: Recent Labs  Lab 09/24/19 1256 09/25/19 0636 09/26/19 0547 09/27/19 0429  WBC 8.4 6.0 8.3 8.2  NEUTROABS 6.9  --  6.5 6.1  HGB 13.5 13.3 11.8* 11.4*  HCT 40.4 40.3 35.0* 33.4*  MCV 90.4 90.4 89.3 88.8  PLT 154 145* 133* 008*   Basic Metabolic Panel: Recent Labs  Lab 09/24/19 1256 09/24/19 1948 09/25/19 0636 09/26/19 0547  NA 139  --  142 136  K 2.8*  --  3.6 4.3  CL 101  --  103 103  CO2 26  --  31 23  GLUCOSE 113*  --  113* 154*  BUN 8  --  8 8  CREATININE 0.84  --  0.91 0.87  CALCIUM 8.6*  --  8.7* 8.1*  MG  --  1.8 2.3  --    GFR: Estimated Creatinine Clearance: 56.3 mL/min (by C-G formula based on SCr of 0.87 mg/dL). Liver Function Tests: Recent Labs  Lab 09/24/19 1256  AST 26  ALT  21  ALKPHOS 74  BILITOT 1.8*  PROT 6.4*  ALBUMIN 3.6   No results for input(s): LIPASE, AMYLASE in the last 168 hours. No results for input(s): AMMONIA in the last 168 hours. Coagulation Profile: No results for input(s): INR, PROTIME in the last 168 hours. Cardiac Enzymes: No results for input(s): CKTOTAL, CKMB, CKMBINDEX, TROPONINI in the last 168 hours. BNP (last 3 results) No results for input(s): PROBNP in the last 8760 hours. HbA1C: No results for input(s): HGBA1C in the last 72 hours. CBG: No results for input(s): GLUCAP  in the last 168 hours. Lipid Profile: No results for input(s): CHOL, HDL, LDLCALC, TRIG, CHOLHDL, LDLDIRECT in the last 72 hours. Thyroid Function Tests: No results for input(s): TSH, T4TOTAL, FREET4, T3FREE, THYROIDAB in the last 72 hours. Anemia Panel: No results for input(s): VITAMINB12, FOLATE, FERRITIN, TIBC, IRON, RETICCTPCT in the last 72 hours. Sepsis Labs: No results for input(s): PROCALCITON, LATICACIDVEN in the last 168 hours.  Recent Results (from the past 240 hour(s))  SARS Coronavirus 2 by RT PCR (hospital order, performed in [facility] hospital lab) Nasopharyngeal Nasopharyngeal Swab     Status: None   Collection Time: 09/24/19  4:19 PM   Specimen: Nasopharyngeal Swab  Result Value Ref Range Status   SARS Coronavirus 2 NEGATIVE NEGATIVE Final    Comment: (NOTE) If result is NEGATIVE SARS-CoV-2 target nucleic acids are NOT DETECTED. The SARS-CoV-2 RNA is generally detectable in upper and lower  respiratory specimens during the acute phase of infection. The lowest  concentration of SARS-CoV-2 viral copies this assay can detect is 250  copies / mL. A negative result does not preclude SARS-CoV-2 infection  and should not be used as the sole basis for treatment or other  patient management decisions.  A negative result may occur with  improper specimen collection / handling, submission of specimen other  than nasopharyngeal swab, presence of viral mutation(s) within the  areas targeted by this assay, and inadequate number of viral copies  (<250 copies / mL). A negative result must be combined with clinical  observations, patient history, and epidemiological information. If result is POSITIVE SARS-CoV-2 target nucleic acids are DETECTED. The SARS-CoV-2 RNA is generally detectable in upper and lower  respiratory specimens dur ing the acute phase of infection.  Positive  results are indicative of active infection with SARS-CoV-2.  Clinical  correlation with patient history  and other diagnostic information is  necessary to determine patient infection status.  Positive results do  not rule out bacterial infection or co-infection with other viruses. If result is PRESUMPTIVE POSTIVE SARS-CoV-2 nucleic acids MAY BE PRESENT.   A presumptive positive result was obtained on the submitted specimen  and confirmed on repeat testing.  While 2019 novel coronavirus  (SARS-CoV-2) nucleic acids may be present in the submitted sample  additional confirmatory testing may be necessary for epidemiological  and / or clinical management purposes  to differentiate between  SARS-CoV-2 and other Sarbecovirus currently known to infect humans.  If clinically indicated additional testing with an alternate test  methodology [phone]) is advised. The SARS-CoV-2 RNA is generally  detectable in upper and lower respiratory sp ecimens during the acute  phase of infection. The expected result is Negative. Fact Sheet for Patients:  BoilerBrush.com.cy Fact Sheet for Healthcare Providers: https://pope.com/ This test is not yet approved or cleared by the [place] FDA and has been authorized for detection and/or diagnosis of SARS-CoV-2 by FDA under an Emergency Use Authorization (EUA).  This EUA  will remain in effect (meaning this test can be used) for the duration of the COVID-19 declaration under Section 564(b)(1) of the Act, 21 U.S.C. section 360bbb-3(b)(1), unless the authorization is terminated or revoked sooner. Performed at [facility], [address] [place], [place] [address]   MRSA PCR Screening     Status: None   Collection Time: 09/25/19  4:36 AM   Specimen: Nasal Mucosa; Nasopharyngeal  Result Value Ref Range Status   MRSA by PCR NEGATIVE NEGATIVE Final    Comment:        The GeneXpert MRSA Assay (FDA approved for NASAL specimens only), is one component of a comprehensive MRSA colonization surveillance  program. It is not intended to diagnose MRSA infection nor to guide or monitor treatment for MRSA infections. Performed at [facility], [address] [place], [place] [address]      Radiology Studies: No results found.  Scheduled Meds: . buPROPion  150 mg Oral Daily  . Chlorhexidine Gluconate Cloth  6 each Topical Daily  . docusate sodium  100 mg Oral BID  . donepezil  10 mg Oral QHS  . enoxaparin (LOVENOX) injection  40 mg Subcutaneous Q24H  . fluticasone  1 spray Each Nare Daily  . losartan  50 mg Oral Daily  . memantine  10 mg Oral BID  . multivitamin with minerals  1 tablet Oral Daily  . QUEtiapine  100 mg Oral QHS  . simvastatin  20 mg Oral q1800   Continuous Infusions: . methocarbamol (ROBAXIN) IV       LOS: 4 days   Time spent: 30 minutes  Arnetha CourserSumayya Harrie Cazarez, MD [facility] Pager [phone]  If 7PM-7AM, please contact night-coverage [email or web address] Password [email or web address] 09/28/2019, 5:07 PM

## 2019-09-29 DIAGNOSIS — E876 Hypokalemia: Secondary | ICD-10-CM

## 2019-09-29 DIAGNOSIS — W19XXXA Unspecified fall, initial encounter: Secondary | ICD-10-CM

## 2019-09-29 LAB — SURGICAL PATHOLOGY

## 2019-09-29 MED ORDER — DOCUSATE SODIUM 100 MG PO CAPS: 100.0000 mg | ORAL_CAPSULE | Freq: Two times a day (BID) | ORAL | 0 refills | Status: DC

## 2019-09-29 MED ORDER — ENOXAPARIN SODIUM 40 MG/0.4ML ~~LOC~~ SOLN: 40.0000 mg | SUBCUTANEOUS | 0 refills | Status: AC

## 2019-09-29 MED ORDER — ATENOLOL 25 MG PO TABS: 12.5000 mg | ORAL_TABLET | Freq: Every day | ORAL | 0 refills | Status: DC

## 2019-09-29 MED ORDER — ATENOLOL 25 MG PO TABS
12.5000 mg | ORAL_TABLET | Freq: Every day | ORAL | Status: DC
  Filled 2019-09-29: qty 1

## 2019-09-29 MED ORDER — ALUM & MAG HYDROXIDE-SIMETH 200-200-20 MG/5ML PO SUSP: 30.0000 mL | ORAL | 0 refills | Status: DC | PRN

## 2019-09-29 MED ORDER — BISACODYL 10 MG RE SUPP: 10.0000 mg | Freq: Every day | RECTAL | 0 refills | Status: DC | PRN

## 2019-09-29 NOTE — Progress Notes (Signed)
Physical Therapy Treatment Patient Details Name: Erica Walker MRN: [id number] DOB: October 09, 1946 Today's Date: 09/29/2019    History of Present Illness Erica Walker is a 73 y.o. female with h/o HTN, depression, COPD. She presented to the hospital ED on 09/24/2019 complaining of L hip pain after slip and fall the previous day while walking at neigbor's house. Found to have L hip fx and underwent left Hip Anterior Hip Hemiarthroplasty on 09/25/2019.    PT Comments    Pt reporting higher pain level today but still able to progress to seated there ex and ambulate 200 ft with RW.  Pt still with poor safety awareness and requiring cues for use of RW and body mechanics as well as awareness of objects in room.  Pt did demonstrate minimally improved use of RW.  She required increased time to stand from standard height bed and chair but was able to use UE's for support.  Pt able to perform all seated there ex without difficulty and with cues for form and controlled motion.  She did report mild pain increase with seated march but was able to complete a modified version.  Pt will continue to benefit from skilled PT with focus on strength, tolerance to activity, safe functional mobility and pain management.  Follow Up Recommendations  SNF;Supervision for mobility/OOB     Equipment Recommendations  Rolling walker with 5" wheels;3in1 (PT)    Recommendations for Other Services       Precautions / Restrictions Precautions Precautions: Fall Restrictions Weight Bearing Restrictions: Yes LLE Weight Bearing: Weight bearing as tolerated    Mobility  Bed Mobility Overal bed mobility: Modified Independent Bed Mobility: Supine to Sit     Supine to sit: Modified independent (Device/Increase time);HOB elevated     General bed mobility comments: Increased time  Transfers Overall transfer level: Needs assistance Equipment used: Rolling walker (2 wheeled) Transfers: Sit to/from Stand Sit to Stand: From  elevated surface;Min guard         General transfer comment: Slowly stands from bedside with use of UE's.  VC's for use of RW.  Ambulation/Gait Ambulation/Gait assistance: Min guard Gait Distance (Feet): 200 Feet Assistive device: Rolling walker (2 wheeled) Gait Pattern/deviations: Step-through pattern;Decreased step length - right;Decreased stride length;Decreased stance time - left;Trunk flexed   Gait velocity interpretation: 1.31 - 2.62 ft/sec, indicative of limited community ambulator General Gait Details: Gait more antalgic today with pt stating "It hurts a little more.  It ain't too bad".   Stairs             Wheelchair Mobility    Modified Rankin (Stroke Patients Only)       Balance Overall balance assessment: Needs assistance;History of Falls Sitting-balance support: Feet supported Sitting balance-Leahy Scale: Normal     Standing balance support: Bilateral upper extremity supported Standing balance-Leahy Scale: Fair Standing balance comment: Patient dependent on BUE support on RW to maintain balance while ambulating                            Cognition Arousal/Alertness: Awake/alert Behavior During Therapy: WFL for tasks assessed/performed Overall Cognitive Status: History of cognitive impairments - at baseline                                 General Comments: Requires multiple cues for safety during gait.      Exercises Other Exercises Other Exercises: Seated  heel raises x20, LAQ's x10, march x20, pillow squeeze x10, hip abduction x10    General Comments        Pertinent Vitals/Pain      Home Living                      Prior Function            PT Goals (current goals can now be found in the care plan section) Acute Rehab PT Goals Patient Stated Goal: get back to being her active self PT Goal Formulation: With patient Time For Goal Achievement: 10/10/19 Potential to Achieve Goals: Good Progress  towards PT goals: Progressing toward goals    Frequency    BID      PT Plan Current plan remains appropriate    Co-evaluation              AM-PAC PT "6 Clicks" Mobility   Outcome Measure  Help needed turning from your back to your side while in a flat bed without using bedrails?: None Help needed moving from lying on your back to sitting on the side of a flat bed without using bedrails?: A Little Help needed moving to and from a bed to a chair (including a wheelchair)?: A Little Help needed standing up from a chair using your arms (e.g., wheelchair or bedside chair)?: A Little Help needed to walk in hospital room?: A Little Help needed climbing 3-5 steps with a railing? : A Lot 6 Click Score: 18    End of Session Equipment Utilized During Treatment: Gait belt Activity Tolerance: Patient tolerated treatment well Patient left: with call bell/phone within reach;with SCD's reapplied;in chair;with chair alarm set Nurse Communication: (Nursing in to check on pt during treatment.) PT Visit Diagnosis: Unsteadiness on feet (R26.81);Muscle weakness (generalized) (M62.81);History of falling (Z91.81);Difficulty in walking, not elsewhere classified (R26.2)     Time: 0962-8366 PT Time Calculation (min) (ACUTE ONLY): 23 min  Charges:  $Therapeutic Exercise: 23-37 mins                     Glenetta Hew, PT, DPT    Glenetta Hew 09/29/2019, 12:48 PM

## 2019-09-29 NOTE — TOC Transition Note (Signed)
Transition of Care [facility]) - CM/SW Discharge Note   Patient Details  Name: Erica Walker MRN: [id number] Date of Birth: 1946/03/31  [facility] CM/SW Contact:  Su Hilt, RN Phone Number: 09/29/2019, 2:15 PM   Clinical Narrative:    Patient will DC to [facility] to room B5 today via EMS transport.  I notified Juliann Pulse the patient's legal guardian.  The bedside nurse is to call report to [facility] and call EMS for Transport, the DC packet is on the chart   Final next level of care: Skilled Nursing Facility Barriers to Discharge: Barriers Resolved   Patient Goals and CMS Choice Patient states their goals for this hospitalization and ongoing recovery are:: To go to rehab. CMS Medicare.gov Compare Post Acute Care list provided to:: Patient Choice offered to / list presented to : Patient  Discharge Placement                       Discharge Plan and Services In-house Referral: Clinical Social Work   Post Acute Care Choice: [place]                               Social Determinants of Health (SDOH) Interventions     Readmission Risk Interventions No flowsheet data found.

## 2019-09-29 NOTE — Progress Notes (Signed)
Attempted to call report to [facility] but after 6 minutes no one picked up the line.  Number provided for facility to call to receive report on pt.

## 2019-09-29 NOTE — Progress Notes (Signed)
Non emergent transport services contacted for transport of patient to [facility]. All personal belongings with patient.  Patient displays no s/sx of distress.   Report called to [facility].

## 2019-09-29 NOTE — Discharge Summary (Signed)
Physician Discharge Summary  Erica Walker DZH:[id number] DOB: May 06, 1946 DOA: 09/24/2019  PCP: Sharilyn Sites, MD  Admit date: 09/24/2019 Discharge date: 09/29/2019  Admitted From: Home Disposition: Skilled nursing facility for rehab.  Recommendations for Outpatient Follow-up:  1. Follow up with PCP in 1-2 weeks 2. Please obtain BMP/CBC in one week 3. Please follow up on the following pending results:  Home Health: No  Discharge Condition: Stable CODE STATUS: Full Diet recommendation: Heart Healthy  Brief/Interim Summary: 73 year old lady with past medical history significant for hypertension, and dementia was admitted after having a fall resulted in left hip fracture.  She was treated with left hip hemiarthroplasty.  She did well with PT and was able to ambulate with the help of her walker.  Pain was well controlled.  She will be discharged to a skilled nursing facility for rehab.  Mild asymptomatic sinus bradycardia.  She did developed mild bradycardia initially and her home dose of atenolol was held.  We restarted her atenolol at a lower dose of 12.5 instead of 25 mg daily on discharge as her heart rate was in 90s.  Atenolol  can be increased to 25 if needed.  Hypertension.  Continue home dose of losartan.  Dementia.  Continue her home meds.  Discharge Diagnoses:  Active Problems:   Hip fracture (HCC)   Pressure injury of skin   Fall   Discharge Instructions  Discharge Instructions    Diet - low sodium heart healthy   Complete by: As directed    Discharge instructions   Complete by: As directed    It was pleasure taking care of you. As we discussed I am decreasing the dose of atenolol to 12.5 mg daily instead of 25 mg.  Your PCP can increase the dose if needed. Continue rest of your home medications.   Increase activity slowly   Complete by: As directed      Allergies as of 09/29/2019      Reactions   Other Rash   Allergy to Bel-tab      Medication List     TAKE these medications   acetaminophen 500 MG tablet Commonly known as: TYLENOL Take 500 mg by mouth.   alum & mag hydroxide-simeth 200-200-20 MG/5ML suspension Commonly known as: MAALOX/MYLANTA Take 30 mLs by mouth every 4 (four) hours as needed for indigestion.   aspirin EC 81 MG tablet Take 81 mg by mouth daily.   atenolol 25 MG tablet Commonly known as: TENORMIN Take 0.5 tablets (12.5 mg total) by mouth daily. What changed: how much to take   bisacodyl 10 MG suppository Commonly known as: DULCOLAX Place 1 suppository (10 mg total) rectally daily as needed for moderate constipation.   buPROPion 150 MG 24 hr tablet Commonly known as: WELLBUTRIN XL Take 150 mg by mouth daily.   citalopram 10 MG tablet Commonly known as: CELEXA Take by mouth.   docusate sodium 100 MG capsule Commonly known as: COLACE Take 1 capsule (100 mg total) by mouth 2 (two) times daily.   donepezil 10 MG tablet Commonly known as: ARICEPT Take 10 mg by mouth at bedtime.   Dristan Spray 0.05 % nasal spray Generic drug: oxymetazoline 1 spray by Each Nare route daily as needed.   enoxaparin 40 MG/0.4ML injection Commonly known as: LOVENOX Inject 0.4 mLs (40 mg total) into the skin daily for 7 days. Start taking on: September 30, 2019   fluticasone 50 MCG/ACT nasal spray Commonly known as: FLONASE Two sprays in each nostril once  daily.   losartan 50 MG tablet Commonly known as: COZAAR Take 50 mg by mouth daily.   memantine 10 MG tablet Commonly known as: NAMENDA Take 10 mg by mouth 2 (two) times daily.   mirabegron ER 25 MG Tb24 tablet Commonly known as: Myrbetriq Take 1 tablet (25 mg total) by mouth daily.   multivitamin with minerals tablet Take by mouth.   QUEtiapine 100 MG tablet Commonly known as: SEROQUEL Take 100 mg by mouth at bedtime.   simvastatin 20 MG tablet Commonly known as: ZOCOR Take 20 mg by mouth.   valACYclovir 1000 MG tablet Commonly known as: VALTREX       Contact information for after-discharge care    Destination    [facility] .   Service: Skilled Nursing Contact information: [address] [facility] [place] [address] [phone]             Allergies  Allergen Reactions  . Other Rash    Allergy to Bel-tab    Consultations: Orthopedic surgery-Dr. Nyoka Lint   Procedures/Studies: Dg Chest 1 View  Result Date: 09/24/2019 CLINICAL DATA:  Fall yesterday on left hip EXAM: CHEST  1 VIEW COMPARISON:  None. FINDINGS: Normal heart size. Normal mediastinal contour. No pneumothorax. No pleural effusion. Lungs appear clear, with no acute consolidative airspace disease and no pulmonary edema. IMPRESSION: No active disease. Electronically Signed   By: Delbert Phenix M.D.   On: 09/24/2019 12:33   Ct Head Wo Contrast  Result Date: 09/24/2019 CLINICAL DATA:  Status post fall yesterday.  Initial encounter. EXAM: CT HEAD WITHOUT CONTRAST TECHNIQUE: Contiguous axial images were obtained from the base of the skull through the vertex without intravenous contrast. COMPARISON:  None. FINDINGS: Brain: No evidence of acute infarction, hemorrhage, hydrocephalus, extra-axial collection or mass lesion/mass effect. Atrophy and chronic microvascular ischemic change noted. Vascular: No hyperdense vessel or unexpected calcification. Skull: Intact.  No focal lesion. Sinuses/Orbits: Negative. Other: None. IMPRESSION: No acute abnormality. Atrophy and chronic microvascular ischemic change. Electronically Signed   By: Drusilla Kanner M.D.   On: 09/24/2019 14:02   Dg Hip Operative Unilat W Or W/o Pelvis Left  Result Date: 09/25/2019 CLINICAL DATA:  Intraoperative localization for left total hip replacement. EXAM: OPERATIVE LEFT HIP (WITH PELVIS IF PERFORMED) 1 VIEWS TECHNIQUE: Fluoroscopic spot image(s) were submitted for interpretation post-operatively. COMPARISON:  Prior radiograph from 09/24/2019. FINDINGS: Single intraoperative  fluoroscopic view of the left hip demonstrates placement of a left total hip arthroplasty. Visualized hardware appears well position without adverse features. Fluoroscopy time equals 9 seconds. Dose = 0.5 mGy. IMPRESSION: Intraoperative fluoroscopic localization for left total hip arthroplasty. Electronically Signed   By: Rise Mu M.D.   On: 09/25/2019 17:22   Dg Hip Unilat With Pelvis 2-3 Views Left  Result Date: 09/24/2019 CLINICAL DATA:  Left hip pain secondary to a fall yesterday. EXAM: DG HIP (WITH OR WITHOUT PELVIS) 2-3V LEFT COMPARISON:  None. FINDINGS: There is an impacted subcapital fracture left femoral neck with slight angulation. No dislocation. The pelvic bones are intact. IMPRESSION: Impacted subcapital fracture of the left femoral neck with slight angulation. Electronically Signed   By: Francene Boyers M.D.   On: 09/24/2019 12:32   (Echo, Carotid, EGD, Colonoscopy, ERCP)    Subjective: Patient was feeling better on the day of discharge.  She was able to ambulate without any difficulty and pain was well controlled.  Discharge Exam: Vitals:   09/28/19 2309 09/29/19 0725  BP: 129/62 (!) 151/87  Pulse: 97 97  Resp: 16 16  Temp: 98.4 F (36.9 C) 98.1 F (36.7 C)  SpO2: 96% 97%   Vitals:   09/28/19 0723 09/28/19 1508 09/28/19 2309 09/29/19 0725  BP: (!) 145/70 (!) 147/72 129/62 (!) 151/87  Pulse: 94 94 97 97  Resp: 18 18 16 16   Temp: 98.6 F (37 C) 98.4 F (36.9 C) 98.4 F (36.9 C) 98.1 F (36.7 C)  TempSrc: Oral Oral Oral   SpO2: 94% 96% 96% 97%  Weight:      Height:        General: Pt is alert, awake, not in acute distress Cardiovascular: RRR, S1/S2 +, no rubs, no gallops Respiratory: CTA bilaterally, no wheezing, no rhonchi Abdominal: Soft, NT, ND, bowel sounds + Extremities: no edema, no cyanosis    The results of significant diagnostics from this hospitalization (including imaging, microbiology, ancillary and laboratory) are listed below for  reference.     Microbiology: Recent Results (from the past 240 hour(s))  SARS Coronavirus 2 by RT PCR (hospital order, [facility]) Nasopharyngeal Nasopharyngeal Swab     Status: None   Collection Time: 09/24/19  4:19 PM   Specimen: Nasopharyngeal Swab  Result Value Ref Range Status   SARS Coronavirus 2 NEGATIVE NEGATIVE Final    Comment: (NOTE) If result is NEGATIVE SARS-CoV-2 target nucleic acids are NOT DETECTED. The SARS-CoV-2 RNA is generally detectable in upper and lower  respiratory specimens during the acute phase of infection. The lowest  concentration of SARS-CoV-2 viral copies this assay can detect is 250  copies / mL. A negative result does not preclude SARS-CoV-2 infection  and should not be used as the sole basis for treatment or other  patient management decisions.  A negative result may occur with  improper specimen collection / handling, submission of specimen other  than nasopharyngeal swab, presence of viral mutation(s) within the  areas targeted by this assay, and inadequate number of viral copies  (<250 copies / mL). A negative result must be combined with clinical  observations, patient history, and epidemiological information. If result is POSITIVE SARS-CoV-2 target nucleic acids are DETECTED. The SARS-CoV-2 RNA is generally detectable in upper and lower  respiratory specimens dur ing the acute phase of infection.  Positive  results are indicative of active infection with SARS-CoV-2.  Clinical  correlation with patient history and other diagnostic information is  necessary to determine patient infection status.  Positive results do  not rule out bacterial infection or co-infection with other viruses. If result is PRESUMPTIVE POSTIVE SARS-CoV-2 nucleic acids MAY BE PRESENT.   A presumptive positive result was obtained on the submitted specimen  and confirmed on repeat testing.  While 2019 novel coronavirus  (SARS-CoV-2) nucleic  acids may be present in the submitted sample  additional confirmatory testing may be necessary for epidemiological  and / or clinical management purposes  to differentiate between  SARS-CoV-2 and other Sarbecovirus currently known to infect humans.  If clinically indicated additional testing with an alternate test  methodology [phone]([id number]) is advised. The SARS-CoV-2 RNA is generally  detectable in upper and lower respiratory sp ecimens during the acute  phase of infection. The expected result is Negative. Fact Sheet for Patients:  BoilerBrush.com.cyhttps://www.fda.gov/media/136312/download Fact Sheet for Healthcare Providers: https://pope.com/https://www.fda.gov/media/136313/download This test is not yet approved or cleared by the [place] FDA and has been authorized for detection and/or diagnosis of SARS-CoV-2 by FDA under an Emergency Use Authorization (EUA).  This EUA will remain  in effect (meaning this test can be used) for the duration of the COVID-19 declaration under Section 564(b)(1) of the Act, 21 U.S.C. section 360bbb-3(b)(1), unless the authorization is terminated or revoked sooner. Performed at [facility], [address] [place], [place] [address]   MRSA PCR Screening     Status: None   Collection Time: 09/25/19  4:36 AM   Specimen: Nasal Mucosa; Nasopharyngeal  Result Value Ref Range Status   MRSA by PCR NEGATIVE NEGATIVE Final    Comment:        The GeneXpert MRSA Assay (FDA approved for NASAL specimens only), is one component of a comprehensive MRSA colonization surveillance program. It is not intended to diagnose MRSA infection nor to guide or monitor treatment for MRSA infections. Performed at [facility], [address] [place], [place] [address]      Labs: BNP (last 3 results) No results for input(s): BNP in the last 8760 hours. Basic Metabolic Panel: Recent Labs  Lab 09/24/19 1256 09/24/19 1948 09/25/19 0636 09/26/19 0547  NA 139  --  142 136  K 2.8*   --  3.6 4.3  CL 101  --  103 103  CO2 26  --  31 23  GLUCOSE 113*  --  113* 154*  BUN 8  --  8 8  CREATININE 0.84  --  0.91 0.87  CALCIUM 8.6*  --  8.7* 8.1*  MG  --  1.8 2.3  --    Liver Function Tests: Recent Labs  Lab 09/24/19 1256  AST 26  ALT 21  ALKPHOS 74  BILITOT 1.8*  PROT 6.4*  ALBUMIN 3.6   No results for input(s): LIPASE, AMYLASE in the last 168 hours. No results for input(s): AMMONIA in the last 168 hours. CBC: Recent Labs  Lab 09/24/19 1256 09/25/19 0636 09/26/19 0547 09/27/19 0429  WBC 8.4 6.0 8.3 8.2  NEUTROABS 6.9  --  6.5 6.1  HGB 13.5 13.3 11.8* 11.4*  HCT 40.4 40.3 35.0* 33.4*  MCV 90.4 90.4 89.3 88.8  PLT 154 145* 133* 149*   Cardiac Enzymes: No results for input(s): CKTOTAL, CKMB, CKMBINDEX, TROPONINI in the last 168 hours. BNP: Invalid input(s): POCBNP CBG: No results for input(s): GLUCAP in the last 168 hours. D-Dimer No results for input(s): DDIMER in the last 72 hours. Hgb A1c No results for input(s): HGBA1C in the last 72 hours. Lipid Profile No results for input(s): CHOL, HDL, LDLCALC, TRIG, CHOLHDL, LDLDIRECT in the last 72 hours. Thyroid function studies No results for input(s): TSH, T4TOTAL, T3FREE, THYROIDAB in the last 72 hours.  Invalid input(s): FREET3 Anemia work up No results for input(s): VITAMINB12, FOLATE, FERRITIN, TIBC, IRON, RETICCTPCT in the last 72 hours. Urinalysis No results found for: COLORURINE, APPEARANCEUR, [place], [place], [place], [place], [place], [place], PROTEINUR, UROBILINOGEN, NITRITE, LEUKOCYTESUR Sepsis Labs Invalid input(s): PROCALCITONIN,  WBC,  LACTICIDVEN Microbiology Recent Results (from the past 240 hour(s))  SARS Coronavirus 2 by RT PCR (hospital order, performed in [facility] hospital lab) Nasopharyngeal Nasopharyngeal Swab     Status: None   Collection Time: 09/24/19  4:19 PM   Specimen: Nasopharyngeal Swab  Result Value Ref Range Status   SARS Coronavirus 2 NEGATIVE NEGATIVE  Final    Comment: (NOTE) If result is NEGATIVE SARS-CoV-2 target nucleic acids are NOT DETECTED. The SARS-CoV-2 RNA is generally detectable in upper and lower  respiratory specimens during the acute phase of infection. The lowest  concentration of SARS-CoV-2 viral copies this assay can detect  is 250  copies / mL. A negative result does not preclude SARS-CoV-2 infection  and should not be used as the sole basis for treatment or other  patient management decisions.  A negative result may occur with  improper specimen collection / handling, submission of specimen other  than nasopharyngeal swab, presence of viral mutation(s) within the  areas targeted by this assay, and inadequate number of viral copies  (<250 copies / mL). A negative result must be combined with clinical  observations, patient history, and epidemiological information. If result is POSITIVE SARS-CoV-2 target nucleic acids are DETECTED. The SARS-CoV-2 RNA is generally detectable in upper and lower  respiratory specimens dur ing the acute phase of infection.  Positive  results are indicative of active infection with SARS-CoV-2.  Clinical  correlation with patient history and other diagnostic information is  necessary to determine patient infection status.  Positive results do  not rule out bacterial infection or co-infection with other viruses. If result is PRESUMPTIVE POSTIVE SARS-CoV-2 nucleic acids MAY BE PRESENT.   A presumptive positive result was obtained on the submitted specimen  and confirmed on repeat testing.  While 2019 novel coronavirus  (SARS-CoV-2) nucleic acids may be present in the submitted sample  additional confirmatory testing may be necessary for epidemiological  and / or clinical management purposes  to differentiate between  SARS-CoV-2 and other Sarbecovirus currently known to infect humans.  If clinically indicated additional testing with an alternate test  methodology [phone]) is advised. The  SARS-CoV-2 RNA is generally  detectable in upper and lower respiratory sp ecimens during the acute  phase of infection. The expected result is Negative. Fact Sheet for Patients:  BoilerBrush.com.cy Fact Sheet for Healthcare Providers: https://pope.com/ This test is not yet approved or cleared by the [place] FDA and has been authorized for detection and/or diagnosis of SARS-CoV-2 by FDA under an Emergency Use Authorization (EUA).  This EUA will remain in effect (meaning this test can be used) for the duration of the COVID-19 declaration under Section 564(b)(1) of the Act, 21 U.S.C. section 360bbb-3(b)(1), unless the authorization is terminated or revoked sooner. Performed at [facility], [address] [place], [place] [address]   MRSA PCR Screening     Status: None   Collection Time: 09/25/19  4:36 AM   Specimen: Nasal Mucosa; Nasopharyngeal  Result Value Ref Range Status   MRSA by PCR NEGATIVE NEGATIVE Final    Comment:        The GeneXpert MRSA Assay (FDA approved for NASAL specimens only), is one component of a comprehensive MRSA colonization surveillance program. It is not intended to diagnose MRSA infection nor to guide or monitor treatment for MRSA infections. Performed at [facility], [address] [place], [place] [address]      Time coordinating discharge: Over 30 minutes  SIGNED:   Arnetha Courser, MD  [facility] 09/29/2019, 1:06 PM Pager [phone]  If 7PM-7AM, please contact night-coverage [email or web address] Password [email or web address]

## 2019-09-29 NOTE — Care Management Important Message (Signed)
Important Message  Patient Details  Name: Erica Walker MRN: [id number] Date of Birth: February 02, 1946   Medicare Important Message Given:  Yes     [name] 09/29/2019, 12:39 PM

## 2020-02-02 IMAGING — CT CT HEAD W/O CM
3 series · 16 of 45 positions shown, 19 images · non-contrast
Comparison: None.

CLINICAL DATA: Status post fall yesterday.  Initial encounter.

EXAM:
CT HEAD WITHOUT CONTRAST
TECHNIQUE: Contiguous axial images were obtained from the base of the skull
through the vertex without intravenous contrast.

[Series 2: head wo · axial · 0.40mm/px · z∈[-70,+45]mm · 10 of 28 slices shown, 13 images]
[im 3/28  brain]
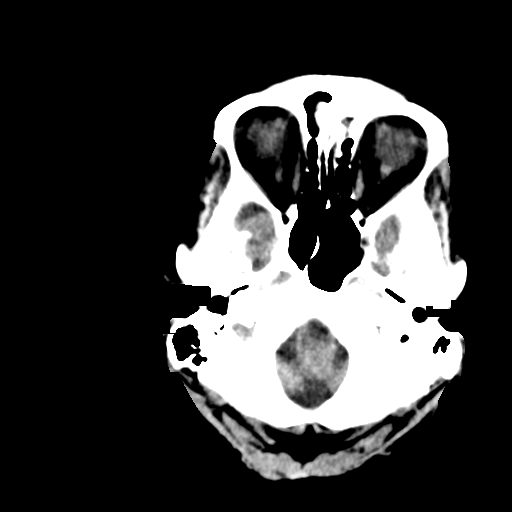
[im 3/28  bone]
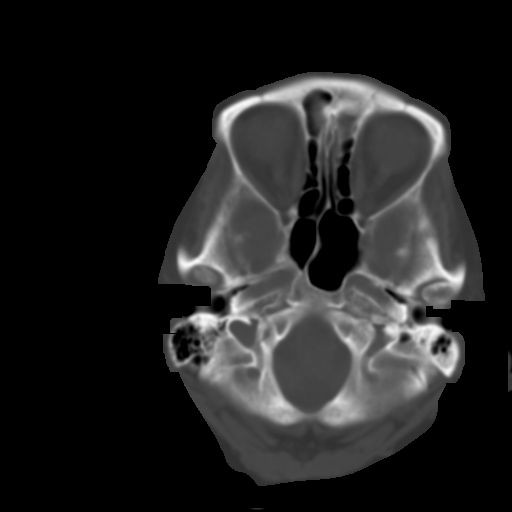
[im 5/28  brain]
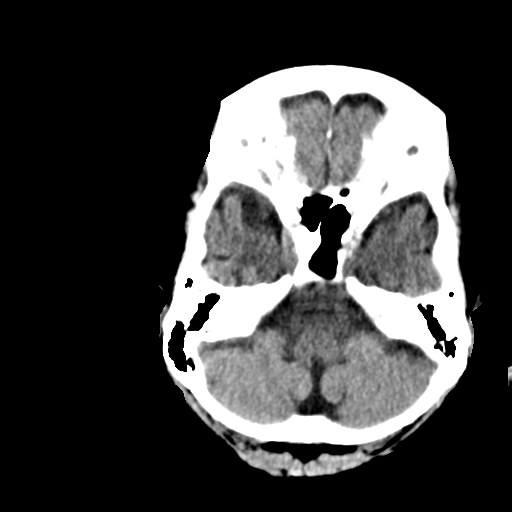
[im 8/28  brain]
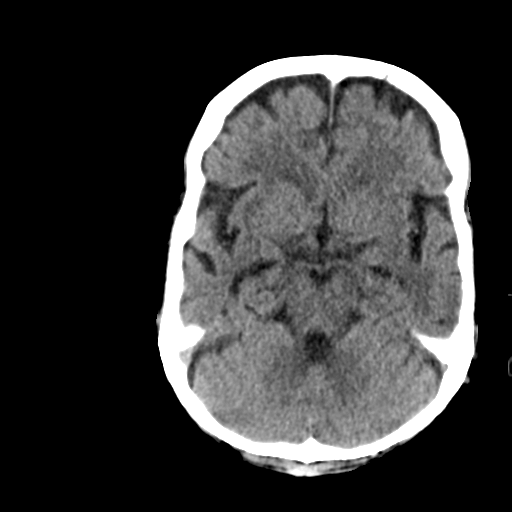
[im 11/28  brain]
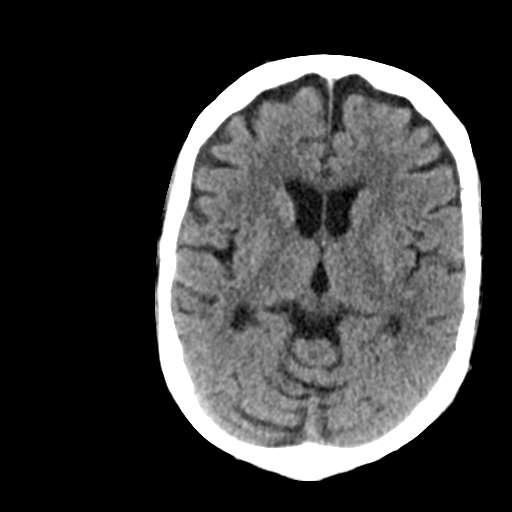
[im 13/28  brain]
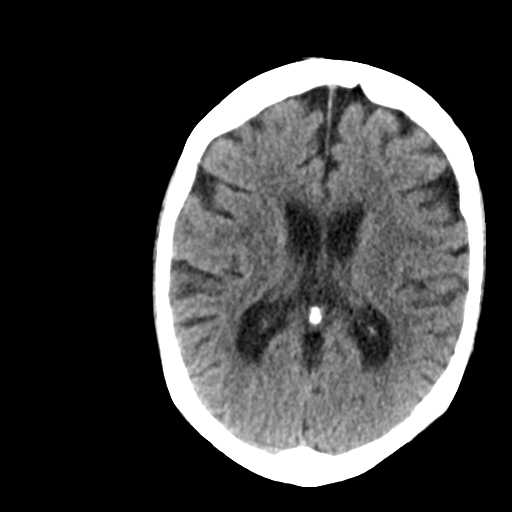
[im 13/28  bone]
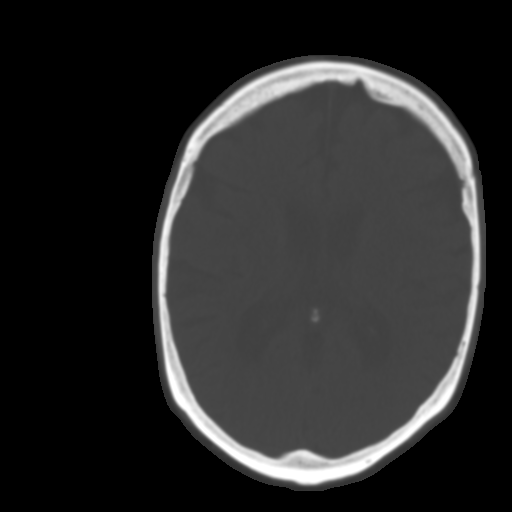
[im 16/28  brain]
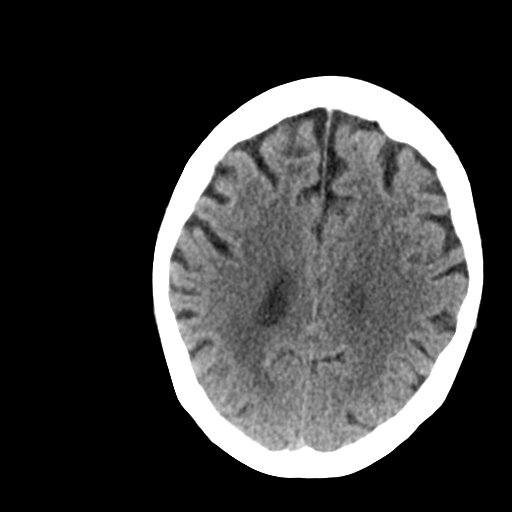
[im 18/28  brain]
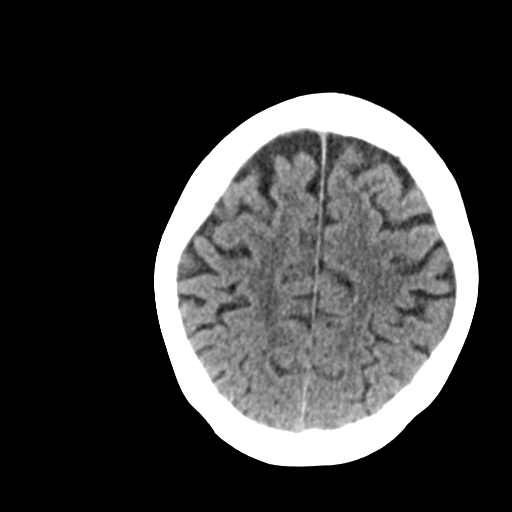
[im 21/28  brain]
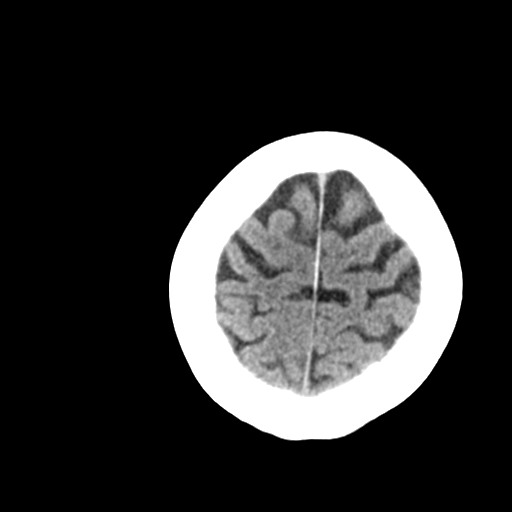
[im 24/28  brain]
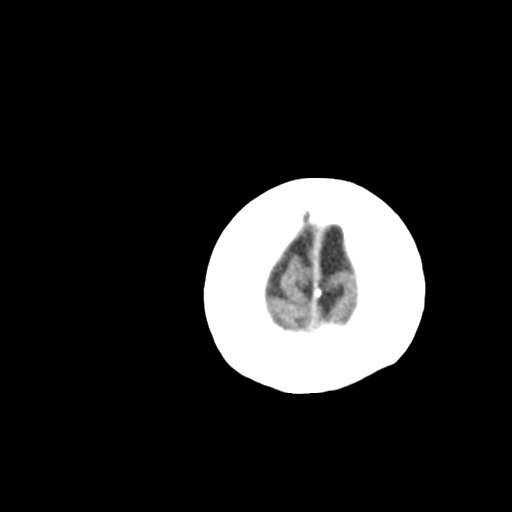
[im 24/28  bone]
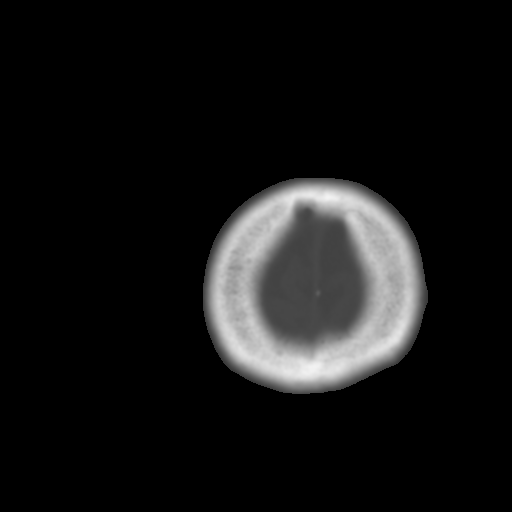
[im 26/28  brain]
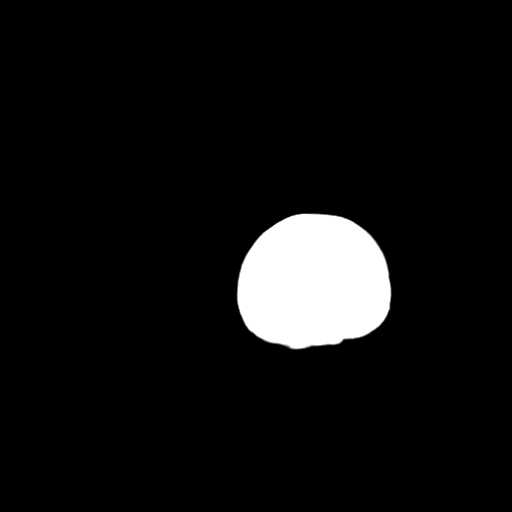

[Series 4: coronal soft tissue · coronal · 0.29mm/px · 3 of 60 slices shown]
[im 20/60  brain]
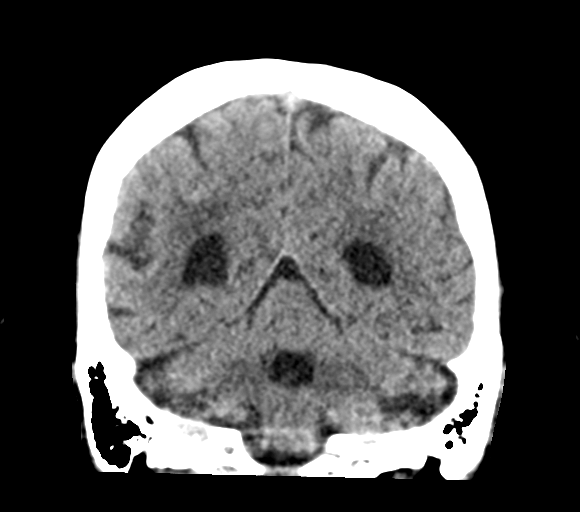
[im 27/60  brain]
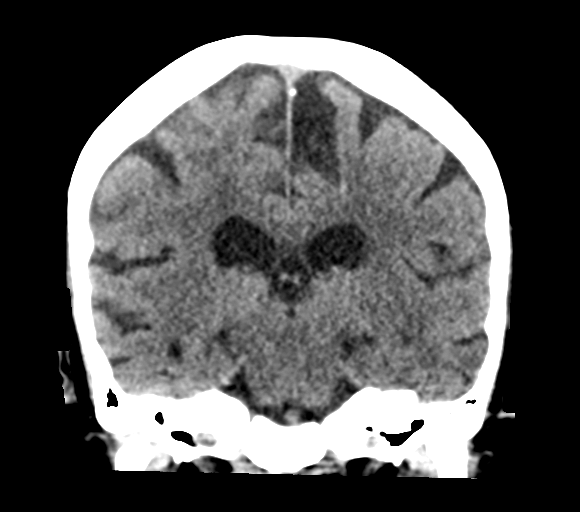
[im 33/60  brain]
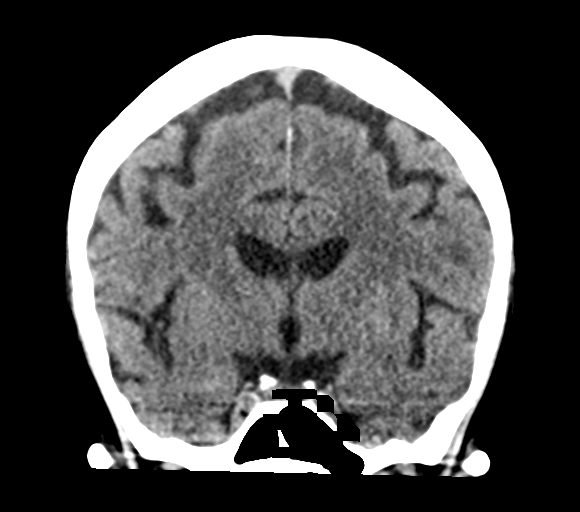

[Series 5: sagittal soft tissue · sagittal · 0.30mm/px · 3 of 46 slices shown]
[im 16/46  brain]
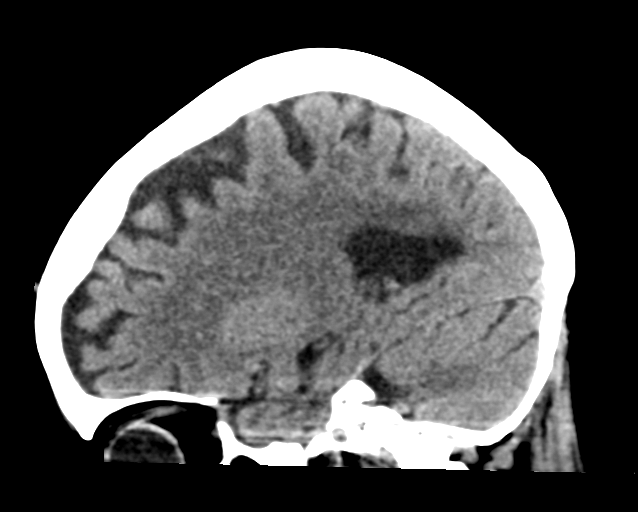
[im 23/46  brain]
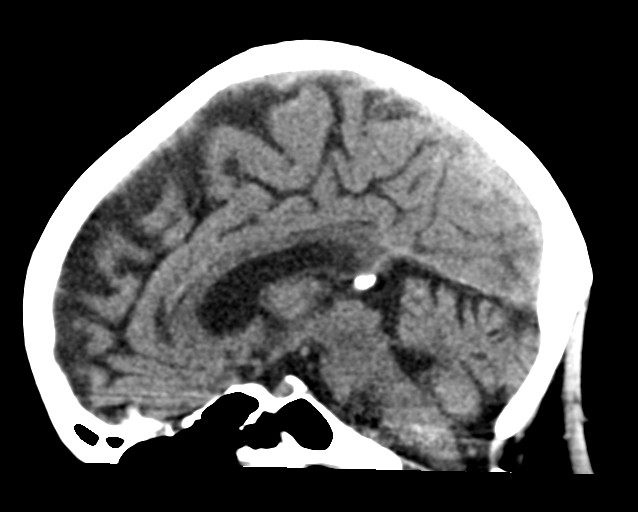
[im 31/46  brain]
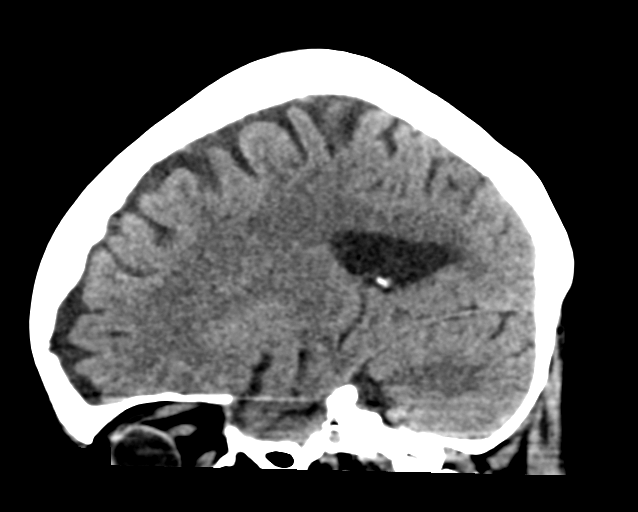

[16 of 45 positions shown; findings below may reference images not displayed]

FINDINGS: Brain: No evidence of acute infarction, hemorrhage, hydrocephalus,
extra-axial collection or mass lesion/mass effect. Atrophy and
chronic microvascular ischemic change noted.

Vascular: No hyperdense vessel or unexpected calcification.

Skull: Intact.  No focal lesion.

Sinuses/Orbits: Negative.

Other: None.
IMPRESSION: No acute abnormality.

Atrophy and chronic microvascular ischemic change.

## 2020-02-02 IMAGING — CR DG HIP (WITH OR WITHOUT PELVIS) 2-3V*L*
1 series · 3 of 3 positions shown · non-contrast
Comparison: None.

CLINICAL DATA: Left hip pain secondary to a fall yesterday.

EXAM:
DG HIP (WITH OR WITHOUT PELVIS) 2-3V LEFT

[Series 1: dg hip unilat w or w/o pelvis 2-3 views  · non-contrast · 0.14mm/px · 3 of 3 slices shown]
[im 1/3]
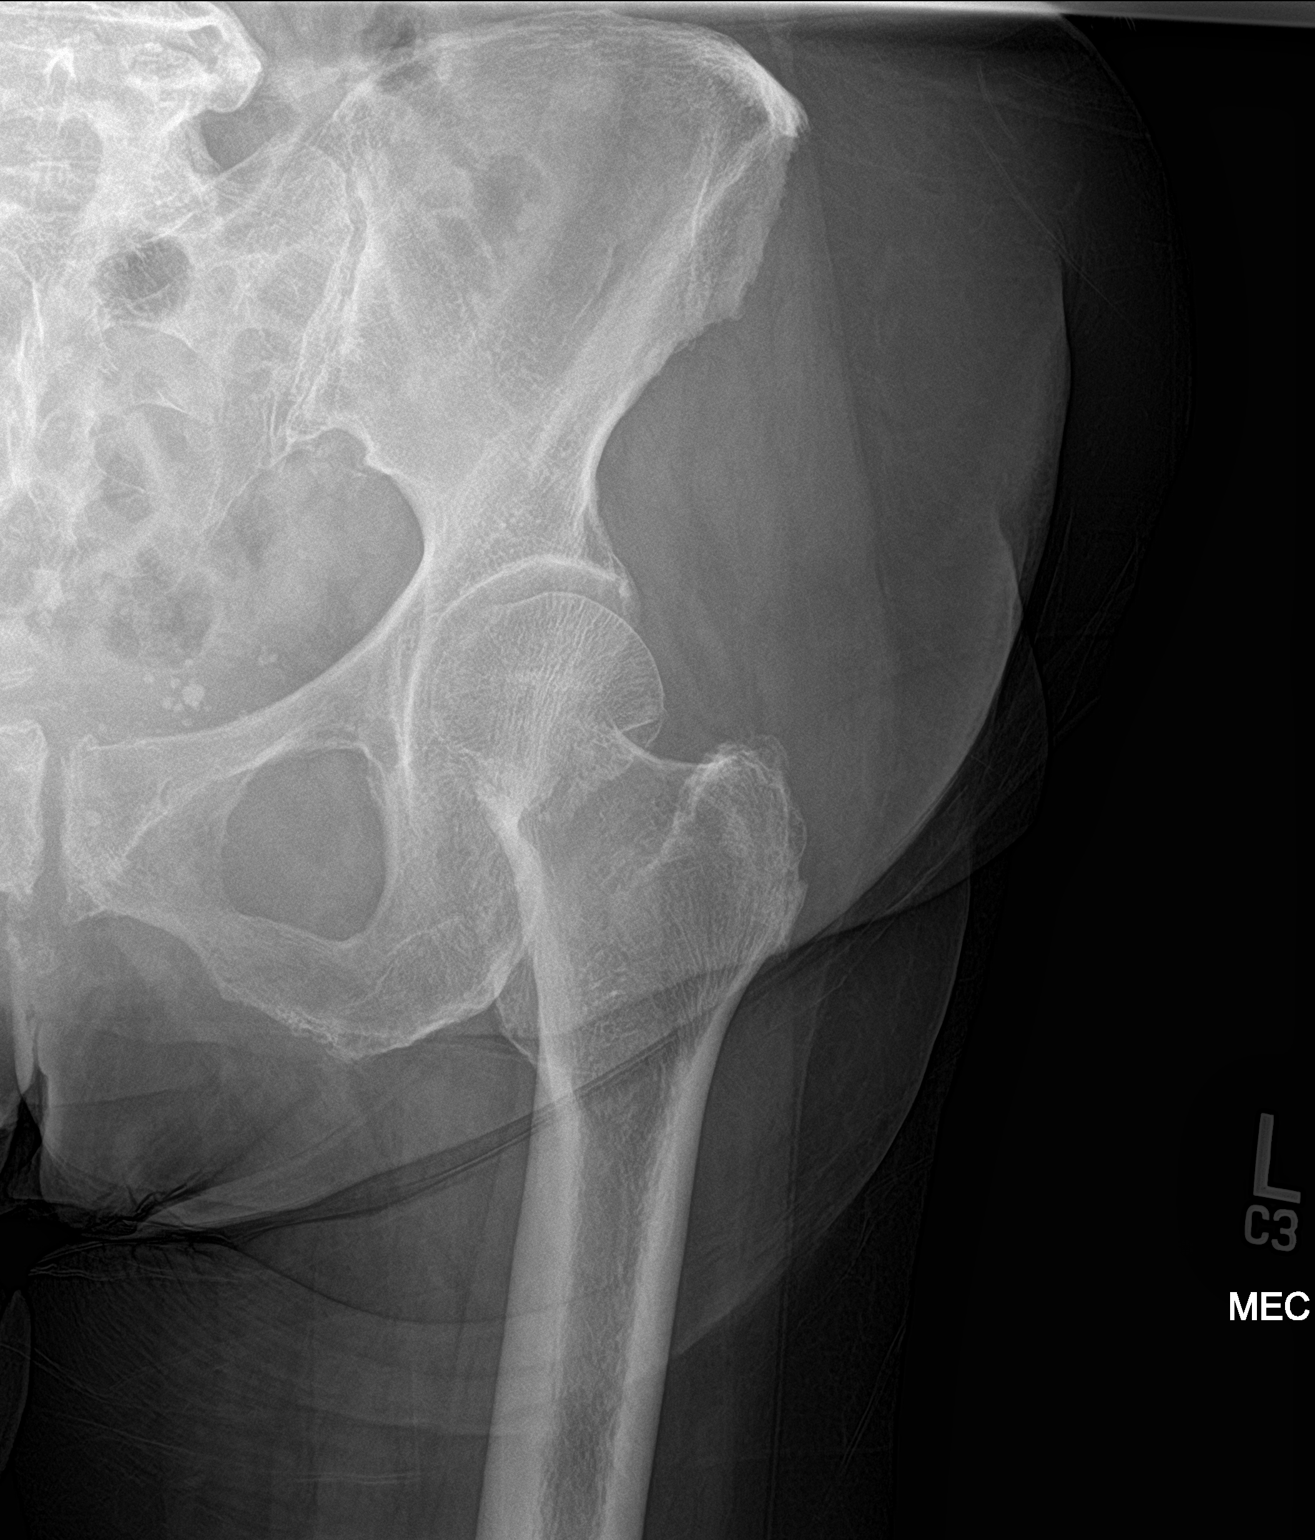
[im 2/3]
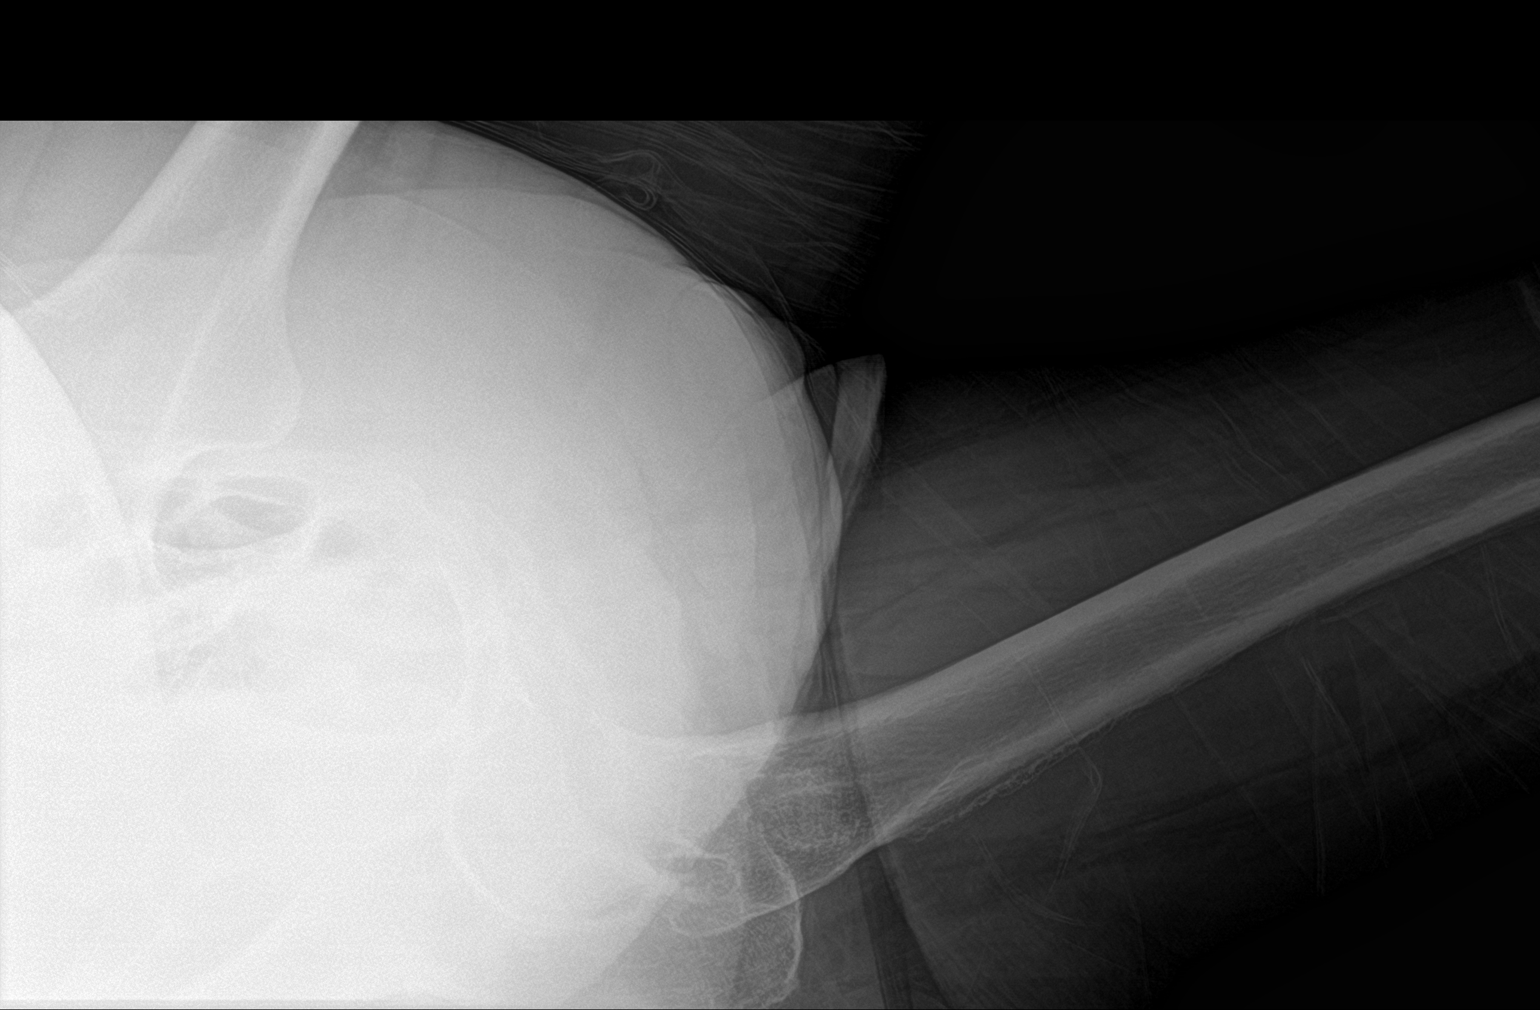
[im 3/3]
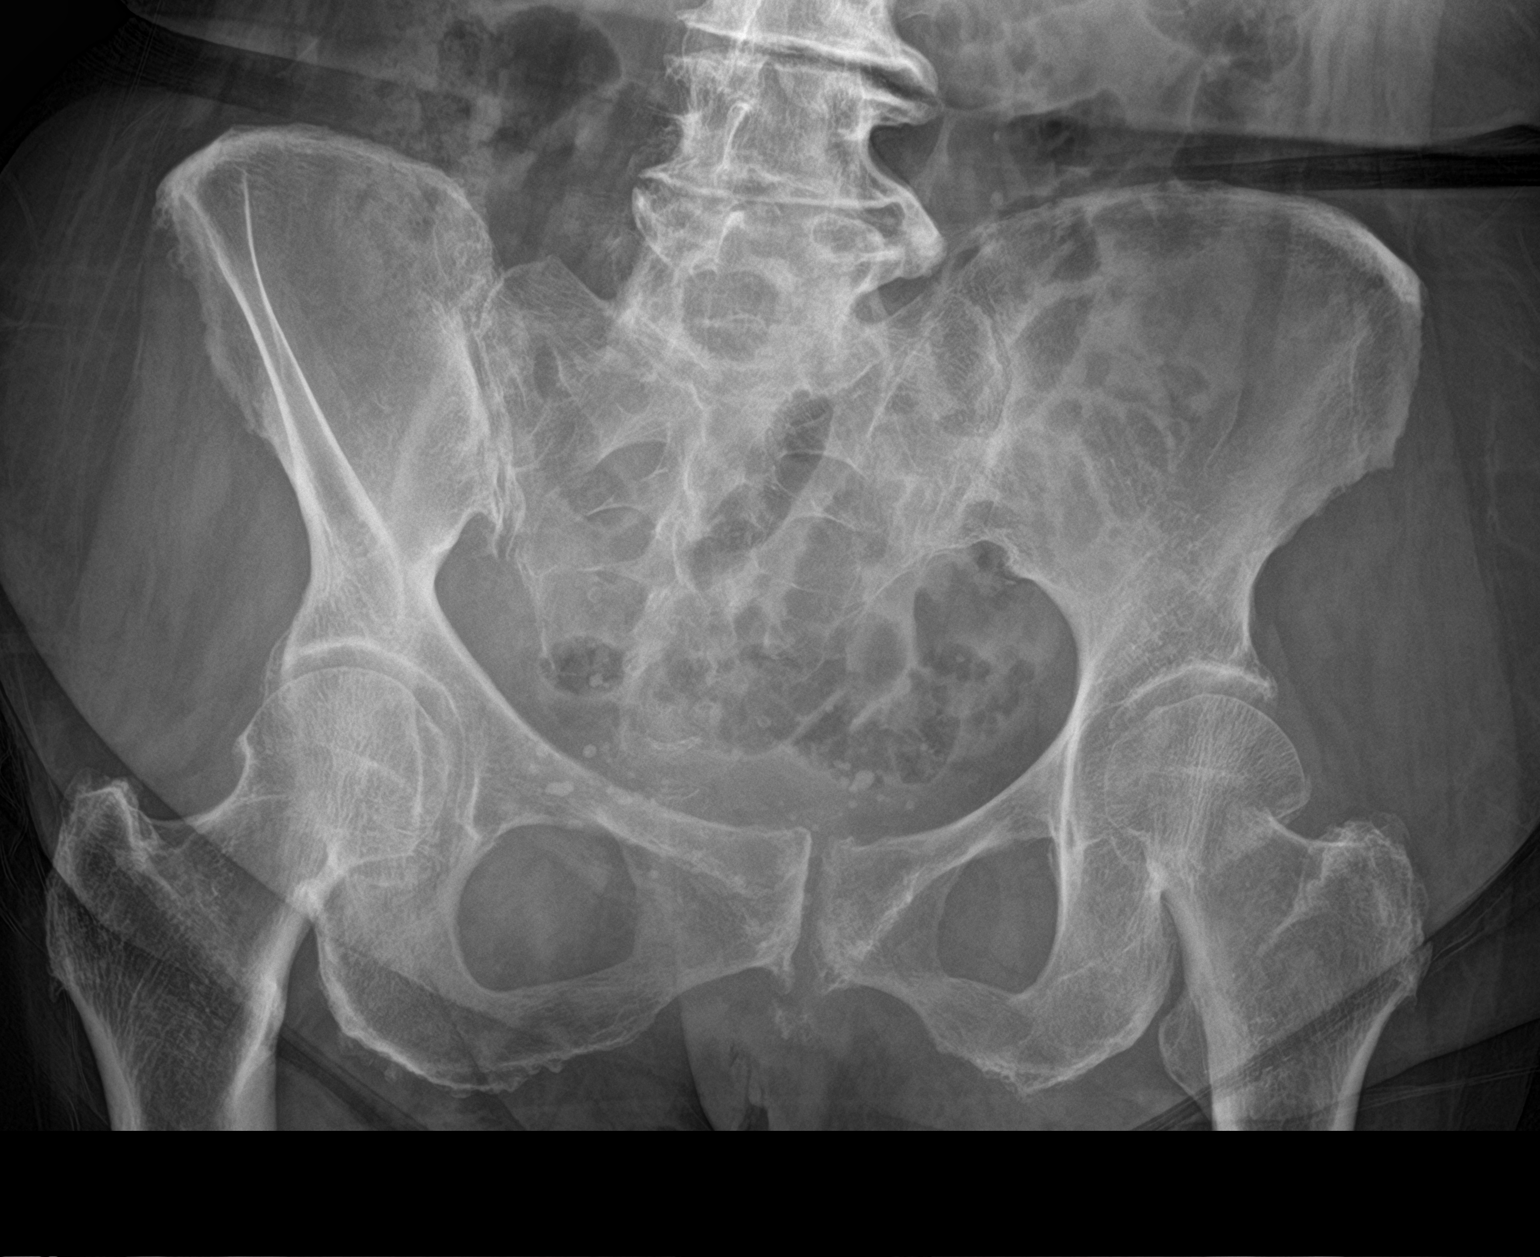

[3 of 3 positions shown; findings below may reference images not displayed]

FINDINGS: There is an impacted subcapital fracture left femoral neck with
slight angulation. No dislocation.

The pelvic bones are intact.
IMPRESSION: Impacted subcapital fracture of the left femoral neck with slight
angulation.

## 2020-02-03 IMAGING — XA DG HIP (WITH PELVIS) OPERATIVE*L*
1 series · 1 of 1 positions shown · non-contrast
Comparison: Prior radiograph from 09/24/2019.

CLINICAL DATA: Intraoperative localization for left total hip
replacement.

EXAM:
OPERATIVE LEFT HIP (WITH PELVIS IF PERFORMED) 1 VIEWS
TECHNIQUE: Fluoroscopic spot image(s) were submitted for interpretation
post-operatively.

[Series 2: cont. · 1 of 1 slices shown]
[im 1/1]
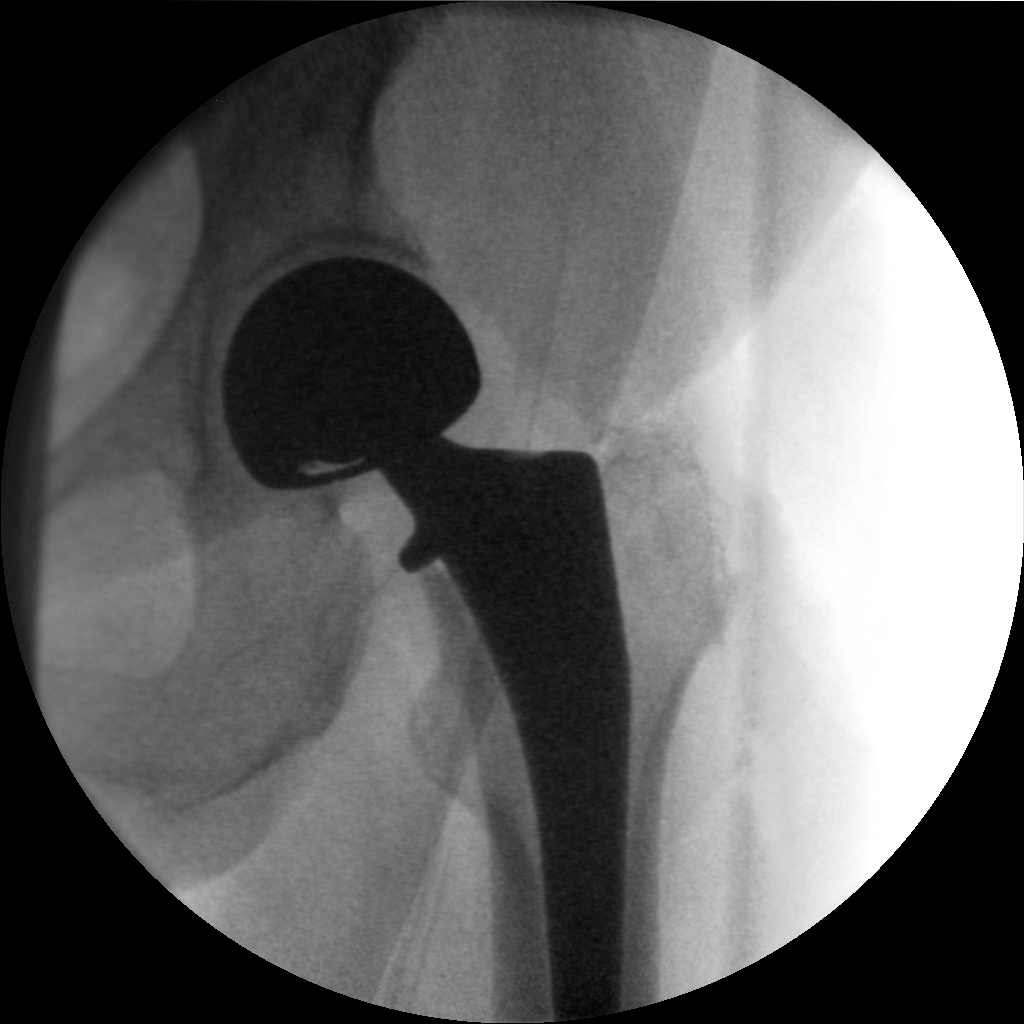

[1 of 1 positions shown; findings below may reference images not displayed]

FINDINGS: Single intraoperative fluoroscopic view of the left hip demonstrates
placement of a left total hip arthroplasty. Visualized hardware
appears well position without adverse features.

Fluoroscopy time equals 9 seconds.

Dose = 0.5 mGy.
IMPRESSION: Intraoperative fluoroscopic localization for left total hip
arthroplasty.

## 2023-11-01 ENCOUNTER — Encounter (INDEPENDENT_AMBULATORY_CARE_PROVIDER_SITE_OTHER): Payer: Self-pay | Admitting: Vascular Surgery

## 2023-11-01 ENCOUNTER — Ambulatory Visit (INDEPENDENT_AMBULATORY_CARE_PROVIDER_SITE_OTHER): Payer: Medicare Other | Admitting: Vascular Surgery

## 2023-11-01 VITALS — BP 126/79 | HR 52 | Resp 18 | Wt 160.0 lb

## 2023-11-01 DIAGNOSIS — E782 Mixed hyperlipidemia: Secondary | ICD-10-CM

## 2023-11-01 DIAGNOSIS — I872 Venous insufficiency (chronic) (peripheral): Secondary | ICD-10-CM

## 2023-11-01 DIAGNOSIS — J449 Chronic obstructive pulmonary disease, unspecified: Secondary | ICD-10-CM

## 2023-11-01 DIAGNOSIS — I10 Essential (primary) hypertension: Secondary | ICD-10-CM | POA: Diagnosis not present

## 2023-11-01 DIAGNOSIS — K219 Gastro-esophageal reflux disease without esophagitis: Secondary | ICD-10-CM

## 2023-11-04 ENCOUNTER — Encounter (INDEPENDENT_AMBULATORY_CARE_PROVIDER_SITE_OTHER): Payer: Self-pay | Admitting: Vascular Surgery

## 2023-11-04 DIAGNOSIS — I872 Venous insufficiency (chronic) (peripheral): Secondary | ICD-10-CM | POA: Insufficient documentation

## 2023-11-04 NOTE — Progress Notes (Signed)
MRN : [id number]  Erica Walker is a 77 y.o. ([birth year]) female who presents with chief complaint of legs swell.  History of Present Illness:  Patient is seen for evaluation of leg swelling. The patient first noticed the swelling remotely but is now concerned because of a significant increase in the overall edema. The swelling isn't associated with significant pain.  There has been an increasing amount of  discoloration noted by the patient. The patient notes that in the morning the legs are improved but they steadily worsened throughout the course of the day. Elevation seems to make the swelling of the legs better, dependency makes them much worse.   There is no history of ulcerations associated with the swelling.   The patient denies any recent changes in their medications.  The patient has not been wearing graduated compression.  The patient has no had any past angiography, interventions or vascular surgery.  The patient denies a history of DVT or PE. There is no prior history of phlebitis. There is no history of primary lymphedema.  There is no history of radiation treatment to the groin or pelvis No history of malignancies. No history of trauma or groin or pelvic surgery. No history of foreign travel or parasitic infections area   Current Meds  Medication Sig   acetaminophen (TYLENOL) 500 MG tablet Take 500 mg by mouth.   aspirin EC 81 MG tablet Take 81 mg by mouth daily.    bisacodyl (DULCOLAX) 10 MG suppository Place 1 suppository (10 mg total) rectally daily as needed for moderate constipation.   citalopram (CELEXA) 10 MG tablet Take by mouth.   divalproex (DEPAKOTE SPRINKLE) 125 MG capsule Take 125 mg by mouth 3 (three) times daily.   docusate sodium (COLACE) 100 MG capsule Take 1 capsule (100 mg total) by mouth 2 (two) times daily.   donepezil (ARICEPT) 10 MG tablet Take 10 mg by mouth at bedtime.    fluticasone (FLONASE) 50 MCG/ACT nasal  spray Two sprays in each nostril once daily.   furosemide (LASIX) 20 MG tablet Take 20 mg by mouth daily.   losartan (COZAAR) 50 MG tablet Take 50 mg by mouth daily.    memantine (NAMENDA) 10 MG tablet Take 10 mg by mouth 2 (two) times daily.   mineral oil-hydrophilic petrolatum (AQUAPHOR) ointment Apply 1 Application topically as needed.   mirabegron ER (MYRBETRIQ) 25 MG TB24 tablet Take 1 tablet (25 mg total) by mouth daily.   Multiple Vitamins-Minerals (MULTIVITAMIN WITH MINERALS) tablet Take by mouth.   potassium chloride (MICRO-K) 10 MEQ CR capsule Take 10 mEq by mouth daily.   QUEtiapine (SEROQUEL) 100 MG tablet Take 100 mg by mouth at bedtime.    simvastatin (ZOCOR) 20 MG tablet Take 20 mg by mouth.    Past Medical History:  Diagnosis Date   Acid reflux    Anxiety    Dementia (HCC)    Depression    Hypertension     Past Surgical History:  Procedure Laterality Date   CHOLECYSTECTOMY     EYE SURGERY     gallstones     HIP ARTHROPLASTY Left 09/25/2019   Procedure: ARTHROPLASTY BIPOLAR HIP (HEMIARTHROPLASTY);  Surgeon: Lyndle Herrlich, MD;  Location: [facility] ORS;  Service: Orthopedics;  Laterality: Left;    Social History Social History   Tobacco Use  Smoking status: Never   Smokeless tobacco: Never  Vaping Use   Vaping status: Never Used  Substance Use Topics   Alcohol use: No   Drug use: No    Family History Family History  Problem Relation Age of Onset   CAD Father     Allergies  Allergen Reactions   Other Rash    Allergy to Bel-tab     REVIEW OF SYSTEMS (Negative unless checked)  Constitutional: [] Weight loss  [] Fever  [] Chills Cardiac: [] Chest pain   [] Chest pressure   [] Palpitations   [] Shortness of breath when laying flat   [] Shortness of breath with exertion. Vascular:  [] Pain in legs with walking   [x] Pain in legs with standing  [] History of DVT   [] Phlebitis   [x] Swelling in legs   [] Varicose veins   [] Non-healing ulcers Pulmonary:   [] Uses home  oxygen   [] Productive cough   [] Hemoptysis   [] Wheeze  [] COPD   [] Asthma Neurologic:  [] Dizziness   [] Seizures   [] History of stroke   [] History of TIA  [] Aphasia   [] Vissual changes   [] Weakness or numbness in arm   [] Weakness or numbness in leg Musculoskeletal:   [] Joint swelling   [] Joint pain   [] Low back pain Hematologic:  [] Easy bruising  [] Easy bleeding   [] Hypercoagulable state   [] Anemic Gastrointestinal:  [] Diarrhea   [] Vomiting  [x] Gastroesophageal reflux/heartburn   [] Difficulty swallowing. Genitourinary:  [] Chronic kidney disease   [] Difficult urination  [] Frequent urination   [] Blood in urine Skin:  [] Rashes   [] Ulcers  Psychological:  [x] History of anxiety   []  History of major depression.  Physical Examination  Vitals:   11/01/23 1000  BP: 126/79  Pulse: (!) 52  Resp: 18  Weight: 160 lb (72.6 kg)   Body mass index is 27.46 kg/m. Gen: WD/WN, NAD Head: [place]/AT, No temporalis wasting.  Ear/Nose/Throat: Hearing grossly intact, nares w/o erythema or drainage, pinna without lesions Eyes: PER, EOMI, sclera nonicteric.  Neck: Supple, no gross masses.  No JVD.  Pulmonary:  Good air movement, no audible wheezing, no use of accessory muscles.  Cardiac: RRR, precordium not hyperdynamic. Vascular:  scattered varicosities present bilaterally.  Mild venous stasis changes to the legs bilaterally.  2+ soft pitting edema, CEAP [id number]  Vessel Right Left  Radial Palpable Palpable  Gastrointestinal: soft, non-distended. No guarding/no peritoneal signs.  Musculoskeletal: M/S 5/5 throughout.  No deformity.  Neurologic: CN 2-12 intact. Pain and light touch intact in extremities.  Symmetrical.  Speech is fluent. Motor exam as listed above. Psychiatric: Judgment intact, Mood & affect appropriate for pt's clinical situation. Dermatologic: Venous rashes no ulcers noted.  No changes consistent with cellulitis. Lymph : No lichenification or skin changes of chronic lymphedema.  CBC Lab  Results  Component Value Date   WBC 8.2 09/27/2019   HGB 11.4 (L) 09/27/2019   HCT 33.4 (L) 09/27/2019   MCV 88.8 09/27/2019   PLT 149 (L) 09/27/2019    BMET    Component Value Date/Time   NA 136 09/26/2019 0547   K 4.3 09/26/2019 0547   CL 103 09/26/2019 0547   CO2 23 09/26/2019 0547   GLUCOSE 154 (H) 09/26/2019 0547   BUN 8 09/26/2019 0547   CREATININE 0.87 09/26/2019 0547   CALCIUM 8.1 (L) 09/26/2019 0547   GFRNONAA >60 09/26/2019 0547   GFRAA >60 09/26/2019 0547   CrCl cannot be calculated (Patient's most recent lab result is older than the maximum 21 days allowed.).  COAG No results  found for: "INR", "PROTIME"  Radiology No results found.   Assessment/Plan 1. Chronic venous insufficiency Recommend:  No surgery or intervention at this point in time.  I have reviewed my discussion with the patient regarding venous insufficiency and why it causes symptoms. I have discussed with the patient the chronic skin changes that accompany venous insufficiency and the long term sequela such as ulceration. Patient will contnue wearing graduated compression stockings on a daily basis, as this has provided excellent control of his edema. The patient will put the stockings on first thing in the morning and removing them in the evening. The patient is reminded not to sleep in the stockings.  In addition, behavioral modification including elevation during the day will be initiated. Exercise is strongly encouraged.  Previous duplex ultrasound of the lower extremities shows normal deep system, no significant superficial reflux was identified.  Given the patient's good control and lack of any problems regarding the venous insufficiency and lymphedema a lymph pump in not need at this time.    The patient will follow up with me PRN should anything change.  The patient voices agreement with this plan.  2. Essential hypertension Continue antihypertensive medications as already ordered,  these medications have been reviewed and there are no changes at this time.  3. Chronic obstructive pulmonary disease, unspecified COPD type (HCC) Continue pulmonary medications and aerosols as already ordered, these medications have been reviewed and there are no changes at this time.   4. Gastroesophageal reflux disease without esophagitis Continue PPI as already ordered, this medication has been reviewed and there are no changes at this time.  Avoidence of caffeine and alcohol  Moderate elevation of the head of the bed   5. Mixed hyperlipidemia Continue statin as ordered and reviewed, no changes at this time    Levora Dredge, MD  11/04/2023 12:06 PM

## 2024-03-05 ENCOUNTER — Emergency Department

## 2024-03-05 ENCOUNTER — Other Ambulatory Visit: Payer: Self-pay

## 2024-03-05 ENCOUNTER — Emergency Department
Admission: EM | Admit: 2024-03-05 | Discharge: 2024-03-06 | Disposition: A | Discharge status: Home / Self Care | Attending: Student in an Organized Health Care Education/Training Program | Admitting: Student in an Organized Health Care Education/Training Program

## 2024-03-05 ENCOUNTER — Encounter: Payer: Self-pay | Admitting: *Deleted

## 2024-03-05 DIAGNOSIS — F0390 Unspecified dementia without behavioral disturbance: Secondary | ICD-10-CM | POA: Diagnosis not present

## 2024-03-05 DIAGNOSIS — Z23 Encounter for immunization: Secondary | ICD-10-CM | POA: Diagnosis not present

## 2024-03-05 DIAGNOSIS — S59912A Unspecified injury of left forearm, initial encounter: Secondary | ICD-10-CM | POA: Diagnosis present

## 2024-03-05 DIAGNOSIS — I10 Essential (primary) hypertension: Secondary | ICD-10-CM | POA: Diagnosis not present

## 2024-03-05 DIAGNOSIS — W1809XA Striking against other object with subsequent fall, initial encounter: Secondary | ICD-10-CM | POA: Insufficient documentation

## 2024-03-05 DIAGNOSIS — S51812A Laceration without foreign body of left forearm, initial encounter: Secondary | ICD-10-CM | POA: Diagnosis not present

## 2024-03-05 DIAGNOSIS — W19XXXA Unspecified fall, initial encounter: Secondary | ICD-10-CM

## 2024-03-05 DIAGNOSIS — S41112A Laceration without foreign body of left upper arm, initial encounter: Secondary | ICD-10-CM

## 2024-03-05 MED ORDER — LORAZEPAM 1 MG PO TABS: 1.0000 mg | ORAL_TABLET | Freq: Once | ORAL | Status: DC | PRN

## 2024-03-05 MED ORDER — TETANUS-DIPHTH-ACELL PERTUSSIS 5-2.5-18.5 LF-MCG/0.5 IM SUSY
0.5000 mL | PREFILLED_SYRINGE | Freq: Once | INTRAMUSCULAR | Status: AC
  Administered 2024-03-05: 0.5 mL via INTRAMUSCULAR
  Filled 2024-03-05: qty 0.5

## 2024-03-05 MED ORDER — LIDOCAINE-EPINEPHRINE-TETRACAINE (LET) TOPICAL GEL
3.0000 mL | Freq: Once | TOPICAL | Status: AC
  Administered 2024-03-05: 3 mL via TOPICAL
  Filled 2024-03-05: qty 3

## 2024-03-05 MED ORDER — BACITRACIN ZINC 500 UNIT/GM EX OINT
TOPICAL_OINTMENT | Freq: Once | CUTANEOUS | Status: AC
  Administered 2024-03-05: 1 via TOPICAL
  Filled 2024-03-05: qty 0.9

## 2024-03-05 MED ORDER — DIVALPROEX SODIUM 125 MG PO CSDR
250.0000 mg | DELAYED_RELEASE_CAPSULE | Freq: Once | ORAL | Status: AC
  Administered 2024-03-05: 250 mg via ORAL
  Filled 2024-03-05: qty 2

## 2024-03-05 MED ORDER — DONEPEZIL HCL 5 MG PO TABS
10.0000 mg | ORAL_TABLET | Freq: Once | ORAL | Status: AC
  Administered 2024-03-05: 10 mg via ORAL
  Filled 2024-03-05: qty 2

## 2024-03-05 MED ORDER — QUETIAPINE FUMARATE 25 MG PO TABS
50.0000 mg | ORAL_TABLET | Freq: Once | ORAL | Status: AC
  Administered 2024-03-05: 50 mg via ORAL
  Filled 2024-03-05: qty 2

## 2024-03-05 NOTE — ED Provider Triage Note (Signed)
 Emergency Medicine Provider Triage Evaluation Note  Erica Walker , a 78 y.o. female  was evaluated in triage.  Pt complains of fall. Patient was brought by EMS. Patient has history of dementia.Patient has laceration on left frontal area, left anterior middle third of left arm. Patient is taking aspirin  Review of Systems  Positive:  Negative:   Physical Exam  Ht 5\' 4"  (1.626 m)   Wt 72 kg   BMI 27.25 kg/m  Gen:   Awake, no distress   Resp:  Normal effort  MSK:   Moves extremities without difficulty  Other:    Medical Decision Making  Medically screening exam initiated at 5:14 PM.  Appropriate orders placed.  Erica Walker was informed that the remainder of the evaluation will be completed by another provider, this initial triage assessment does not replace that evaluation, and the importance of remaining in the ED until their evaluation is complete.  Patient will need laceration repair.    Gladys Damme, PA-C 03/05/24 [phone]

## 2024-03-05 NOTE — ED Notes (Signed)
 Rm 18 [facility] called to transport patient to [facility] spoke with Victorino Dike said it will be several hours

## 2024-03-05 NOTE — ED Triage Notes (Addendum)
 Pt brought in via ems from [facility].  Pt had a witnessed fall per ems and hit the wall and floor.  Hx dementia.  Pt has hematoma to left forehead with an abrasion.  Pt has has a skin tear to left forearm   bleeding controlled.  Pt on stretcher in triage.  Fall alarm on bed turned on .  Pt confused and trying to get off stretcher.    Pt has DNR

## 2024-03-05 NOTE — ED Provider Notes (Signed)
 [facility] Provider Note    Event Date/Time   First MD Initiated Contact with Patient 03/05/24 2018     (approximate)   History   Fall   HPI  Erica Walker is a 78 y.o. female history of advanced dementia presents to the ER from [facility] care for witnessed fall.  Patient hit the head on her fall on the floor.  Has small laceration to forehead as well as laceration to the left forearm.     Physical Exam   Triage Vital Signs: ED Triage Vitals  Encounter Vitals Group     BP 03/05/24 1718 124/61     Systolic BP Percentile --      Diastolic BP Percentile --      Pulse Rate 03/05/24 1718 85     Resp 03/05/24 1718 (!) 22     Temp 03/05/24 1718 98 F (36.7 C)     Temp Source 03/05/24 1718 Axillary     SpO2 03/05/24 1718 96 %     Weight 03/05/24 1713 158 lb 11.7 oz (72 kg)     Height 03/05/24 1713 5\' 4"  (1.626 m)     Head Circumference --      Peak Flow --      Pain Score 03/05/24 1713 5     Pain Loc --      Pain Education --      Exclude from Growth Chart --     Most recent vital signs: Vitals:   03/05/24 1718 03/05/24 2137  BP: 124/61 (!) 120/43  Pulse: 85 67  Resp: (!) 22 18  Temp: 98 F (36.7 C) 97.9 F (36.6 C)  SpO2: 96% 97%     Constitutional: Alert  Eyes: Conjunctivae are normal.  Head: Atraumatic. Nose: No congestion/rhinnorhea. Mouth/Throat: Mucous membranes are moist.   Neck: Painless ROM.  Cardiovascular:   Good peripheral circulation. Respiratory: Normal respiratory effort.  No retractions.  Gastrointestinal: Soft and nontender.  Musculoskeletal: Weaving all extremities no pain or deformity noted. Neurologic:  MAE spontaneously. No gross focal neurologic deficits are appreciated.  Skin: 3 cm laceration to the forearm. Psychiatric: Currently calm and cooperative.    ED Results / Procedures / Treatments   Labs (all labs ordered are listed, but only abnormal results are displayed) Labs Reviewed - No data to  display   RADIOLOGY Please see ED Course for my review and interpretation.  I personally reviewed all radiographic images ordered to evaluate for the above acute complaints and reviewed radiology reports and findings.  These findings were personally discussed with the patient.  Please see medical record for radiology report.    PROCEDURES:  Critical Care performed:   .Laceration Repair  Date/Time: 03/05/2024 9:26 PM  Performed by: Willy Eddy, MD Authorized by: Willy Eddy, MD   Laceration details:    Location:  Shoulder/arm   Shoulder/arm location:  L lower arm   Length (cm):  4 Pre-procedure details:    Preparation:  Patient was prepped and draped in usual sterile fashion Treatment:    Area cleansed with:  Povidone-iodine Skin repair:    Repair method:  Sutures   Suture size:  5-0   Suture material:  Nylon   Number of sutures:  3 Approximation:    Approximation:  Close Repair type:    Repair type:  Simple Post-procedure details:    Dressing:  Antibiotic ointment   Procedure completion:  Tolerated    MEDICATIONS ORDERED IN ED: Medications  LORazepam (ATIVAN)  tablet 1 mg (has no administration in time range)  lidocaine-EPINEPHrine-tetracaine (LET) topical gel (3 mLs Topical Given 03/05/24 2044)  Tdap (BOOSTRIX) injection 0.5 mL (0.5 mLs Intramuscular Given 03/05/24 2135)  bacitracin ointment (1 Application Topical Given 03/05/24 2130)  donepezil (ARICEPT) tablet 10 mg (10 mg Oral Given 03/05/24 2240)  divalproex (DEPAKOTE SPRINKLE) capsule 250 mg (250 mg Oral Given 03/05/24 2245)  QUEtiapine (SEROQUEL) tablet 50 mg (50 mg Oral Given 03/05/24 2239)     IMPRESSION / MDM / ASSESSMENT AND PLAN / ED COURSE  I reviewed the triage vital signs and the nursing notes.                              Differential diagnosis includes, but is not limited to, sdh, iph, fracture, laceration  Patient presenting to the ER for evaluation of symptoms as described above.  Based on  symptoms, risk factors and considered above differential, this presenting complaint could reflect a potentially life-threatening illness therefore the patient will be placed on continuous pulse oximetry and telemetry for monitoring.  CT imaging ordered for the above differential.  Lac repaired as above    Clinical Course as of 03/05/24 2343  Wed Mar 05, 2024  2021 CT head on my review and interpretation without evidence of SDH or IPH. [PR]  2137 CT imaging is reassuring.  With wound repaired does appear appropriate for outpatient follow-up [PR]    Clinical Course User Index [PR] Willy Eddy, MD     FINAL CLINICAL IMPRESSION(S) / ED DIAGNOSES   Final diagnoses:  Fall, initial encounter  Laceration of arm, left, initial encounter     Rx / DC Orders   ED Discharge Orders     None        Note:  This document was prepared using Dragon voice recognition software and may include unintentional dictation errors.    Willy Eddy, MD 03/05/24 [phone]

## 2024-03-05 NOTE — ED Triage Notes (Signed)
 Patient to ED via [facility] from [facility] for witnessed fall. Pt hit head on wall and floor. Bleeding controlled- laceration to forehead. Denies LOC per facility. Hx dementia, HTN. PT angry, cussing and swinging at EMS. Skin tear located on left arm.   160/103

## 2024-03-05 NOTE — ED Notes (Signed)
 Patient' bed linen changed due to incontinence. New sheet, chuck pads, and brief applied. Legal Guardian [name] remains at bedside.

## 2024-03-12 ENCOUNTER — Emergency Department

## 2024-03-12 ENCOUNTER — Other Ambulatory Visit: Payer: Self-pay

## 2024-03-12 ENCOUNTER — Emergency Department
Admission: EM | Admit: 2024-03-12 | Discharge: 2024-03-12 | Disposition: A | Discharge status: Home / Self Care | Attending: Emergency Medicine | Admitting: Emergency Medicine

## 2024-03-12 DIAGNOSIS — E039 Hypothyroidism, unspecified: Secondary | ICD-10-CM | POA: Insufficient documentation

## 2024-03-12 DIAGNOSIS — F0390 Unspecified dementia without behavioral disturbance: Secondary | ICD-10-CM | POA: Insufficient documentation

## 2024-03-12 DIAGNOSIS — J449 Chronic obstructive pulmonary disease, unspecified: Secondary | ICD-10-CM | POA: Diagnosis not present

## 2024-03-12 DIAGNOSIS — S0990XA Unspecified injury of head, initial encounter: Secondary | ICD-10-CM | POA: Diagnosis present

## 2024-03-12 DIAGNOSIS — N3001 Acute cystitis with hematuria: Secondary | ICD-10-CM | POA: Diagnosis not present

## 2024-03-12 DIAGNOSIS — S065X0A Traumatic subdural hemorrhage without loss of consciousness, initial encounter: Secondary | ICD-10-CM | POA: Insufficient documentation

## 2024-03-12 DIAGNOSIS — W1830XA Fall on same level, unspecified, initial encounter: Secondary | ICD-10-CM | POA: Insufficient documentation

## 2024-03-12 DIAGNOSIS — S065XAA Traumatic subdural hemorrhage with loss of consciousness status unknown, initial encounter: Secondary | ICD-10-CM | POA: Diagnosis not present

## 2024-03-12 DIAGNOSIS — W19XXXA Unspecified fall, initial encounter: Secondary | ICD-10-CM | POA: Diagnosis not present

## 2024-03-12 DIAGNOSIS — I10 Essential (primary) hypertension: Secondary | ICD-10-CM | POA: Diagnosis not present

## 2024-03-12 LAB — CBC WITH DIFFERENTIAL/PLATELET
Abs Immature Granulocytes: 0.11 10*3/uL — ABNORMAL HIGH (ref 0.00–0.07)
Basophils Absolute: 0.1 10*3/uL (ref 0.0–0.1)
Basophils Relative: 1 %
Eosinophils Absolute: 0.2 10*3/uL (ref 0.0–0.5)
Eosinophils Relative: 2 %
HCT: 35.2 % — ABNORMAL LOW (ref 36.0–46.0)
Hemoglobin: 11.8 g/dL — ABNORMAL LOW (ref 12.0–15.0)
Immature Granulocytes: 1 %
Lymphocytes Relative: 21 %
Lymphs Abs: 1.7 10*3/uL (ref 0.7–4.0)
MCH: 32.7 pg (ref 26.0–34.0)
MCHC: 33.5 g/dL (ref 30.0–36.0)
MCV: 97.5 fL (ref 80.0–100.0)
Monocytes Absolute: 1 10*3/uL (ref 0.1–1.0)
Monocytes Relative: 13 %
Neutro Abs: 5 10*3/uL (ref 1.7–7.7)
Neutrophils Relative %: 62 %
Platelets: 157 10*3/uL (ref 150–400)
RBC: 3.61 MIL/uL — ABNORMAL LOW (ref 3.87–5.11)
RDW: 12.8 % (ref 11.5–15.5)
WBC: 8 10*3/uL (ref 4.0–10.5)
nRBC: 0 % (ref 0.0–0.2)

## 2024-03-12 LAB — BASIC METABOLIC PANEL WITH GFR
Anion gap: 9 (ref 5–15)
BUN: 18 mg/dL (ref 8–23)
CO2: 27 mmol/L (ref 22–32)
Calcium: 8.5 mg/dL — ABNORMAL LOW (ref 8.9–10.3)
Chloride: 104 mmol/L (ref 98–111)
Creatinine, Ser: 0.99 mg/dL (ref 0.44–1.00)
GFR, Estimated: 59 mL/min — ABNORMAL LOW (ref >60)
Glucose, Bld: 130 mg/dL — ABNORMAL HIGH (ref 70–99)
Potassium: 3.5 mmol/L (ref 3.5–5.1)
Sodium: 140 mmol/L (ref 135–145)

## 2024-03-12 LAB — URINALYSIS, ROUTINE W REFLEX MICROSCOPIC
Bilirubin Urine: NEGATIVE
Glucose, UA: NEGATIVE mg/dL
Hgb urine dipstick: NEGATIVE
Ketones, ur: NEGATIVE mg/dL
Nitrite: NEGATIVE
Protein, ur: NEGATIVE mg/dL
Specific Gravity, Urine: 1.02 (ref 1.005–1.030)
WBC, UA: >50 WBC/hpf (ref 0–5)
pH: 5 (ref 5.0–8.0)

## 2024-03-12 MED ORDER — CEFTRIAXONE SODIUM 1 G IJ SOLR
1.0000 g | Freq: Once | INTRAMUSCULAR | Status: AC
  Administered 2024-03-12: 1 g via INTRAMUSCULAR
  Filled 2024-03-12: qty 10

## 2024-03-12 MED ORDER — LIDOCAINE HCL (PF) 1 % IJ SOLN
2.0000 mL | Freq: Once | INTRAMUSCULAR | Status: AC
  Administered 2024-03-12: 2 mL
  Filled 2024-03-12: qty 5

## 2024-03-12 MED ORDER — LORAZEPAM 2 MG PO TABS
2.0000 mg | ORAL_TABLET | Freq: Once | ORAL | Status: AC
  Administered 2024-03-12: 2 mg via ORAL
  Filled 2024-03-12: qty 1

## 2024-03-12 MED ORDER — HALOPERIDOL LACTATE 5 MG/ML IJ SOLN
2.0000 mg | Freq: Once | INTRAMUSCULAR | Status: AC
  Administered 2024-03-12: 2 mg via INTRAMUSCULAR
  Filled 2024-03-12: qty 1

## 2024-03-12 MED ORDER — HALOPERIDOL LACTATE 5 MG/ML IJ SOLN
5.0000 mg | Freq: Once | INTRAMUSCULAR | Status: AC
  Administered 2024-03-12: 5 mg via INTRAMUSCULAR
  Filled 2024-03-12: qty 1

## 2024-03-12 MED ORDER — SODIUM CHLORIDE 0.9 % IV SOLN: 1.0000 g | Freq: Once | INTRAVENOUS | Status: DC

## 2024-03-12 MED ORDER — QUETIAPINE FUMARATE 25 MG PO TABS
100.0000 mg | ORAL_TABLET | Freq: Once | ORAL | Status: AC
  Administered 2024-03-12: 100 mg via ORAL
  Filled 2024-03-12: qty 4

## 2024-03-12 MED ORDER — CEFUROXIME AXETIL 500 MG PO TABS
500.0000 mg | ORAL_TABLET | Freq: Two times a day (BID) | ORAL | 0 refills | Status: AC
Start: 2024-03-12 — End: 2024-03-17

## 2024-03-12 MED ORDER — LACTATED RINGERS IV BOLUS
1000.0000 mL | Freq: Once | INTRAVENOUS | Status: AC
  Administered 2024-03-12: 1000 mL via INTRAVENOUS

## 2024-03-12 NOTE — ED Notes (Signed)
 Sandy - Director of Nursing called to let staff know. In the last two weeks patient has fallen 7 times. Per staff patient is not normally combative and that started about a week ago. Reports staff gives her IM shot of  haldol PRN and there is no difference in behavior. They report slight right foot drop  Reports last night she got out of the bed and was crawling around on the floor. Baseline ambulates with a walker with no issues.   History dementia

## 2024-03-12 NOTE — Consult Note (Incomplete)
 Consulting Department:  Emergency department  Primary Physician:  Sharilyn Sites, MD  Chief Complaint: Subdural hematoma  History of Present Illness: 03/12/2024 Erica Walker is a 78 y.o. female who presents with the chief complaint of subdural hematoma.  She was found on the floor last night at her nursing facility.  She was confused and combative at baseline.  She has a history of COPD, dementia, hypothyroidism, hypertension, hyperlipidemia.  Did not see any evidence of external trauma.  She was recently seen for another fall 7 days ago with repair of the laceration  Review of Systems:  A 10 point review of systems is negative, except for the pertinent positives and negatives detailed in the HPI.  Past Medical History: Past Medical History:  Diagnosis Date   Acid reflux    Anxiety    Dementia (HCC)    Depression    Hypertension     Past Surgical History: Past Surgical History:  Procedure Laterality Date   CHOLECYSTECTOMY     EYE SURGERY     gallstones     HIP ARTHROPLASTY Left 09/25/2019   Procedure: ARTHROPLASTY BIPOLAR HIP (HEMIARTHROPLASTY);  Surgeon: Lyndle Herrlich, MD;  Location: [facility] ORS;  Service: Orthopedics;  Laterality: Left;    Allergies: Allergies as of 03/12/2024 - Reviewed 03/12/2024  Allergen Reaction Noted   Other Rash 06/19/2013    Medications: No current facility-administered medications for this encounter.  Current Outpatient Medications:    cefUROXime (CEFTIN) 500 MG tablet, Take 1 tablet (500 mg total) by mouth 2 (two) times daily with a meal for 5 days., Disp: 10 tablet, Rfl: 0   acetaminophen (TYLENOL) 500 MG tablet, Take 500 mg by mouth., Disp: , Rfl:    alum & mag hydroxide-simeth (MAALOX/MYLANTA) 200-200-20 MG/5ML suspension, Take 30 mLs by mouth every 4 (four) hours as needed for indigestion. (Patient not taking: Reported on 11/01/2023), Disp: 355 mL, Rfl: 0   aspirin EC 81 MG tablet, Take 81 mg by mouth daily. , Disp: , Rfl:    atenolol  (TENORMIN) 25 MG tablet, Take 0.5 tablets (12.5 mg total) by mouth daily. (Patient not taking: Reported on 11/01/2023), Disp: 30 tablet, Rfl: 0   bisacodyl (DULCOLAX) 10 MG suppository, Place 1 suppository (10 mg total) rectally daily as needed for moderate constipation., Disp: 12 suppository, Rfl: 0   buPROPion (WELLBUTRIN XL) 150 MG 24 hr tablet, Take 150 mg by mouth daily. (Patient not taking: Reported on 11/01/2023), Disp: , Rfl:    citalopram (CELEXA) 10 MG tablet, Take by mouth., Disp: , Rfl:    divalproex (DEPAKOTE SPRINKLE) 125 MG capsule, Take 125 mg by mouth 3 (three) times daily., Disp: , Rfl:    docusate sodium (COLACE) 100 MG capsule, Take 1 capsule (100 mg total) by mouth 2 (two) times daily., Disp: 10 capsule, Rfl: 0   donepezil (ARICEPT) 10 MG tablet, Take 10 mg by mouth at bedtime. , Disp: , Rfl:    enoxaparin (LOVENOX) 40 MG/0.4ML injection, Inject 0.4 mLs (40 mg total) into the skin daily for 7 days. (Patient not taking: Reported on 11/01/2023), Disp: 5 mL, Rfl: 0   fluticasone (FLONASE) 50 MCG/ACT nasal spray, Two sprays in each nostril once daily., Disp: , Rfl:    furosemide (LASIX) 20 MG tablet, Take 20 mg by mouth daily., Disp: , Rfl:    losartan (COZAAR) 50 MG tablet, Take 50 mg by mouth daily. , Disp: , Rfl:    memantine (NAMENDA) 10 MG tablet, Take 10 mg by mouth 2 (  two) times daily., Disp: , Rfl:    mineral oil-hydrophilic petrolatum (AQUAPHOR) ointment, Apply 1 Application topically as needed., Disp: , Rfl:    mirabegron ER (MYRBETRIQ) 25 MG TB24 tablet, Take 1 tablet (25 mg total) by mouth daily., Disp: 90 tablet, Rfl: 0   Multiple Vitamins-Minerals (MULTIVITAMIN WITH MINERALS) tablet, Take by mouth., Disp: , Rfl:    oxymetazoline (DRISTAN SPRAY) 0.05 % nasal spray, 1 spray by Each Nare route daily as needed. (Patient not taking: Reported on 11/01/2023), Disp: , Rfl:    potassium chloride (MICRO-K) 10 MEQ CR capsule, Take 10 mEq by mouth daily., Disp: , Rfl:    QUEtiapine  (SEROQUEL) 100 MG tablet, Take 100 mg by mouth at bedtime. , Disp: , Rfl:    simvastatin (ZOCOR) 20 MG tablet, Take 20 mg by mouth., Disp: , Rfl:    valACYclovir (VALTREX) 1000 MG tablet, , Disp: , Rfl: 0   Social History: Social History   Tobacco Use   Smoking status: Never   Smokeless tobacco: Never  Vaping Use   Vaping status: Never Used  Substance Use Topics   Alcohol use: No   Drug use: No    Family Medical History: Family History  Problem Relation Age of Onset   CAD Father     Physical Examination: Vitals:   03/12/24 2149 03/12/24 2330  BP:  (!) 137/106  Pulse:    Resp:    Temp: (!) 97.3 F (36.3 C)   SpO2:       General: Patient is well developed, well nourished, calm, collected, and in no apparent distress.  NEUROLOGICAL:  Prior to getting antipsychotics and sedatives for combative behavior she was awake moving all 4 extremities but uncooperative with examination.  Her pupils now are round and reactive.  She does stir to stimulation.  Imaging: CT HEAD WO CONTRAST ( ) Result Date: 03/12/2024 CLINICAL DATA:  eval SDH stability EXAM: CT HEAD WITHOUT CONTRAST TECHNIQUE: Contiguous axial images were obtained from the base of the skull through the vertex without intravenous contrast. RADIATION DOSE REDUCTION: This exam was performed according to the departmental dose-optimization program which includes automated exposure control, adjustment of the mA and/or kV according to patient size and/or use of iterative reconstruction technique. COMPARISON:  CT head from earlier today. FINDINGS: Brain: Stable subdural hemorrhage along the right cerebral convexity, right falx and tentorial leaflet. No significant mass effect. No evidence of acute large vascular territory infarct, mass lesion, midline at shift or hydrocephalus. Vascular: No hyperdense vessel or unexpected calcification. Skull: Normal. Negative for fracture or focal lesion. Sinuses/Orbits: Clear sinuses.  No acute  orbital findings. IMPRESSION: Stable subdural hemorrhage along the right cerebral convexity, right falx and tentorial leaflet. No significant mass effect. Electronically Signed   By: Feliberto Harts M.D.   On: 03/12/2024 21:47   DG Hip Unilat W or Wo Pelvis 2-3 Views Left Result Date: 03/12/2024 CLINICAL DATA:  Left hip bruising status post fall. EXAM: DG HIP (WITH OR WITHOUT PELVIS) 2-3V LEFT COMPARISON:  09/25/2019 FINDINGS: Left total hip prosthesis is well seated without periprosthetic fracture or lucency. No fracture or dislocation. IMPRESSION: Uncomplicated left total knee prosthesis. Electronically Signed   By: Acquanetta Belling M.D.   On: 03/12/2024 16:05   DG Chest Portable 1 View Result Date: 03/12/2024 CLINICAL DATA:  Ground-level fall, unwitnessed, dementia, notes slight discomfort when palpating over chest bilaterally. Evaluating for obvious rib fractures EXAM: PORTABLE CHEST - 1 VIEW COMPARISON:  09/23/2009 FINDINGS: Cardiomediastinal silhouette and pulmonary vasculature are  within normal limits. Lungs are clear. IMPRESSION: 1. No acute cardiopulmonary process. 2. No displaced rib fractures. Electronically Signed   By: Elester Grim M.D.   On: 03/12/2024 16:04   CT Cervical Spine Wo Contrast Result Date: 03/12/2024 CLINICAL DATA:  Fall.  Dementia.  Trauma to the head and neck. EXAM: CT CERVICAL SPINE WITHOUT CONTRAST TECHNIQUE: Multidetector CT imaging of the cervical spine was performed without intravenous contrast. Multiplanar CT image reconstructions were also generated. RADIATION DOSE REDUCTION: This exam was performed according to the departmental dose-optimization program which includes automated exposure control, adjustment of the mA and/or kV according to patient size and/or use of iterative reconstruction technique. COMPARISON:  03/05/2024 FINDINGS: Alignment: Normal Skull base and vertebrae: No regional fracture or focal bone lesion. Soft tissues and spinal canal: No traumatic soft  tissue finding. Disc levels: Ordinary mild spondylosis C3-4 through C5-6. Facet osteoarthritis in the cervical region worse on the left than the right. No bony stenosis of the central canal. Mild bony foraminal narrowing which is chronic. Upper chest: Negative Other: None IMPRESSION: No acute or traumatic finding. Ordinary mild spondylosis and facet osteoarthritis. Electronically Signed   By: Bettylou Brunner M.D.   On: 03/12/2024 15:28   CT Head Wo Contrast Result Date: 03/12/2024 CLINICAL DATA:  Unwitnessed fall to the ground.  Dementia. EXAM: CT HEAD WITHOUT CONTRAST TECHNIQUE: Contiguous axial images were obtained from the base of the skull through the vertex without intravenous contrast. RADIATION DOSE REDUCTION: This exam was performed according to the departmental dose-optimization program which includes automated exposure control, adjustment of the mA and/or kV according to patient size and/or use of iterative reconstruction technique. COMPARISON:  03/05/2024 FINDINGS: Brain: There is a low to intermediate density subdural hematoma on the right along the convexity with maximal thickness of 4 mm in the parietal region. No significant mass-effect upon the brain. There is a very minimal hyperdense component along that medial right falx and superior surface of the right tentorium, 1-2 mm in thickness. No subdural blood was identified on the study of 1 week ago, but the density of the lateral convexity collection today suggests that it might have derived from that previous insult. No intraparenchymal blood. No hydrocephalus. No recent stroke. Vascular: There is atherosclerotic calcification of the major vessels at the base of the brain. Skull: No skull fracture Sinuses/Orbits: Clear/normal Other: None IMPRESSION: Low to intermediate density subdural hematoma on the right along the convexity with maximal thickness of 4 mm in the parietal region. No significant mass-effect upon the brain. No hyperdense/acute  component is visible in that region. Very tiny amount of hyperdense subdural blood along the posteromedial falx and upper surface of the right tentorium, 1-2 mm in thickness. No subdural blood was identified on the study of 1 week ago, but the density of the lateral collection today suggests that it might have derived from that previous insult. These results were called by telephone at the time of interpretation on 03/12/2024 at 3:23 pm to provider Dr. Felipe Horton, who verbally acknowledged these results. Electronically Signed   By: Bettylou Brunner M.D.   On: 03/12/2024 15:26     I have personally reviewed the images and agree with the above interpretation.  Labs:    Latest Ref Rng & Units 03/12/2024   11:28 AM 09/27/2019    4:29 AM 09/26/2019    5:47 AM  CBC  WBC 4.0 - 10.5 K/uL 8.0  8.2  8.3   Hemoglobin 12.0 - 15.0 g/dL 16.1  11.4  11.8   Hematocrit 36.0 - 46.0 % 35.2  33.4  35.0   Platelets 150 - 400 K/uL 157  149  133       Latest Ref Rng & Units 03/12/2024   11:28 AM 09/26/2019    5:47 AM 09/25/2019    6:36 AM  BMP  Glucose 70 - 99 mg/dL 409  811  914   BUN 8 - 23 mg/dL 18  8  8    Creatinine 0.44 - 1.00 mg/dL 7.82  9.56  2.13   Sodium 135 - 145 mmol/L 140  136  142   Potassium 3.5 - 5.1 mmol/L 3.5  4.3  3.6   Chloride 98 - 111 mmol/L 104  103  103   CO2 22 - 32 mmol/L 27  23  31    Calcium 8.9 - 10.3 mg/dL 8.5  8.1  8.7         Assessment and Plan: Ms. Fitzwater is a pleasant 78 y.o. female with dementia who lives in a facility and was recently seen for a head trauma.  She comes back in for another episode.  Was found to have a a likely subacute subdural given her previous trauma and the density of the blood.  She was showing a nonfocal neuro examination, was combative.  Was given sedatives and antipsychotics for her agitation.  She has a nonfocal neurologic examination.  Pupils are round and reactive.  She does start to stimulation.  On CT scan it shows stability of her bleed.  Is  currently nonoperative.  We can plan to see her in 1 month with a repeat head CT to monitor for any expansion.  I would avoid Keppra dementia and psychiatric history.  Carroll Clamp, MD/MSCR Dept. of Neurosurgery

## 2024-03-12 NOTE — ED Provider Notes (Signed)
 [facility] Provider Note    Event Date/Time   First MD Initiated Contact with Patient 03/12/24 1037     (approximate)   History   Fall (Via EMS from [facility] nursing facility for being found on floor after falling last night, fall was unwitnessed and staff was unsure how long pt was crawling on floor. Pt is severely confused and combative per baseline and is same today but per EMS who was unable to get VS in route due to her being uncooperative, she seems less energetic. No thinners. No obvious deformity/injury from last night. Pt has been frequently falling lately.)   HPI Erica Walker is a 78 y.o. female with history of COPD, dementia, HTN, HLD, hypothyroidism presenting today for fall.  Patient reportedly had an unwitnessed fall at her facility.  She was found crawling on the ground by staff.  No obvious trauma that they could see.  No specific mental status changes that they were aware of given her history of dementia.  On arrival, she is not able to contribute to history.  She denies pain anywhere at this time though.  Chart review: Recent fall and seen here 7 days ago with laceration repair.     Physical Exam   Triage Vital Signs: ED Triage Vitals  Encounter Vitals Group     BP      Systolic BP Percentile      Diastolic BP Percentile      Pulse      Resp      Temp      Temp src      SpO2      Weight      Height      Head Circumference      Peak Flow      Pain Score      Pain Loc      Pain Education      Exclude from Growth Chart     Most recent vital signs: Vitals:   03/12/24 1140 03/12/24 1145  BP:  (!) 97/46  Pulse:    Resp: 16   Temp: 98 F (36.7 C)   SpO2: 96%     I have reviewed the vital signs. General:  Awake, alert, no acute distress. Head:  Normocephalic, evidence of prior head injury to the left frontal scalp with resolving hematoma.  No new trauma present. EENT:  PERRL, EOMI, Oral mucosa pink and moist, Neck is  supple. Cardiovascular: Regular rate, 2+ distal pulses. Respiratory:  Normal respiratory effort, symmetrical expansion, no distress.   Extremities:  Moving all four extremities through full ROM without pain.  Palpated throughout bilateral upper and lower extremities with no acute pain.  No pain to palpation over C, T, or L-spine.  No obvious chest wall pain. Neuro:  Alert and oriented only to name. Skin:  Warm, dry, no rash.   Psych: Appropriate affect.     ED Results / Procedures / Treatments   Labs (all labs ordered are listed, but only abnormal results are displayed) Labs Reviewed  CBC WITH DIFFERENTIAL/PLATELET - Abnormal; Notable for the following components:      Result Value   RBC 3.61 (*)    Hemoglobin 11.8 (*)    HCT 35.2 (*)    Abs Immature Granulocytes 0.11 (*)    All other components within normal limits  BASIC METABOLIC PANEL WITH GFR - Abnormal; Notable for the following components:   Glucose, Bld 130 (*)    Calcium 8.5 (*)  GFR, Estimated 59 (*)    All other components within normal limits  URINALYSIS, ROUTINE W REFLEX MICROSCOPIC - Abnormal; Notable for the following components:   Color, Urine AMBER (*)    APPearance HAZY (*)    Leukocytes,Ua LARGE (*)    Bacteria, UA RARE (*)    All other components within normal limits     EKG My EKG interpretation: Rate of 79, normal sinus rhythm.  Left bundle branch block.  No acute ST elevations or depressions   RADIOLOGY Imaging pending at time of signout   PROCEDURES:  Critical Care performed: No  Procedures   MEDICATIONS ORDERED IN ED: Medications  haloperidol lactate (HALDOL) injection 5 mg (5 mg Intramuscular Given 03/12/24 1111)  cefTRIAXone (ROCEPHIN) injection 1 g (1 g Intramuscular Given 03/12/24 1303)  lidocaine (PF) (XYLOCAINE) 1 % injection 2 mL (2 mLs Other Given 03/12/24 1303)  LORazepam (ATIVAN) tablet 2 mg (2 mg Oral Given 03/12/24 1332)     IMPRESSION / MDM / ASSESSMENT AND PLAN / ED  COURSE  I reviewed the triage vital signs and the nursing notes.                              Differential diagnosis includes, but is not limited to, ICH, cervical spine injury, rib fracture, dehydration, UTI, chronic dementia  Patient's presentation is most consistent with acute presentation with potential threat to life or bodily function.  Patient is a 78 year old female presenting today for unwitnessed fall at her facility.  Has history of dementia and unable to contribute to exam but do not see any obvious new trauma on her exam at this time.  Palpated throughout her extremities with no pain anywhere.  No pain when palpating her C, T, or L-spine.  No obvious new trauma to the head today but evidence of prior hematomas healing at this point.  Appears at her baseline with nothing else indicated from her facility that she was different.  Will collect laboratory workup and CT imaging of head and neck along with chest x-ray for further evaluation of any acute traumatic injuries.  Patient does appear to have a UTI although not significantly changed from her baseline per friend in the room.  Gave one-time dose of ceftriaxone.  Signed out pending results of CT imaging and x-rays.  If all negative, can be discharged back to facility with treatment for UTI.  The patient is on the cardiac monitor to evaluate for evidence of arrhythmia and/or significant heart rate changes.     FINAL CLINICAL IMPRESSION(S) / ED DIAGNOSES   Final diagnoses:  Acute cystitis with hematuria  Fall, initial encounter     Rx / DC Orders   ED Discharge Orders          Ordered    cefUROXime (CEFTIN) 500 MG tablet  2 times daily with meals        03/12/24 1507             Note:  This document was prepared using Dragon voice recognition software and may include unintentional dictation errors.   Kandee Orion, MD 03/12/24 [phone]

## 2024-03-12 NOTE — Discharge Instructions (Addendum)
 Erica Walker has a small subdural hematoma.  We observed her for greater than 6 hours, repeated this image and it remains the same size.  She develops additional falls or other concerns then please return to the ED.  Furthermore, her urine test showed concerning signs for a bladder infection/UTI.  She was discharged with 5 days of Ceftin to treat this after receiving ceftriaxone in the IV while here.

## 2024-03-12 NOTE — ED Triage Notes (Signed)
 Via EMS from [facility] nursing facility for being found on floor after falling last night, fall was unwitnessed and staff was unsure how long pt was crawling on floor. Pt is severely confused and combative per baseline and is same today but per EMS who was unable to get VS in route due to her being uncooperative, she seems less energetic. No thinners. No obvious deformity/injury from last night. Pt has been frequently falling lately.

## 2024-03-12 NOTE — ED Notes (Signed)
 Patient had Large BM, Assisted to the bathroom  x 2

## 2024-03-12 NOTE — ED Provider Notes (Addendum)
 Patient received in signout from Dr. Karlynn Oyster pending remainder of imaging.  CT scan of the head does demonstrate a subdural hematoma with an acute and chronic component.  She is being treated for a UTI.  I consulted neurosurgery who evaluated the patient and recommends repeat imaging in 6 hours, this is performed to demonstrate stability of hemorrhage.  Her good friend and healthcare POA remains at the bedside and providing assistance with redirection.  She is agreeable with plan of care.  We discussed Ceftin antibiotics to treat UTI, stable CT head the patient is suitable for outpatient management.  Clinical Course as of 03/12/24 2155  Wed Mar 12, 2024  1525 Call from rads regarding CT hea [DS]    Clinical Course User Index [DS] Arline Bennett, MD    .Critical Care  Performed by: Arline Bennett, MD Authorized by: Arline Bennett, MD   Critical care provider statement:    Critical care time (minutes):  30   Critical care time was exclusive of:  Separately billable procedures and treating other patients   Critical care was necessary to treat or prevent imminent or life-threatening deterioration of the following conditions:  CNS failure or compromise   Critical care was time spent personally by me on the following activities:  Development of treatment plan with patient or surrogate, discussions with consultants, evaluation of patient's response to treatment, examination of patient, ordering and review of laboratory studies, ordering and review of radiographic studies, ordering and performing treatments and interventions, pulse oximetry, re-evaluation of patient's condition and review of old charts     Arline Bennett, MD 03/12/24 2155    Arline Bennett, MD 03/12/24 2155

## 2024-03-13 ENCOUNTER — Telehealth: Payer: Self-pay | Admitting: Neurosurgery

## 2024-03-13 DIAGNOSIS — S065XAA Traumatic subdural hemorrhage with loss of consciousness status unknown, initial encounter: Secondary | ICD-10-CM

## 2024-03-13 NOTE — Telephone Encounter (Signed)
 CT has been ordered (to be done around 04/11/24). Dr Felipe Horton saw her as a consult on 03/12/24.

## 2024-03-13 NOTE — Telephone Encounter (Addendum)
-----   Message from Noble Bateman sent at 03/13/2024 12:45 PM EDT ----- Consult of Dr. Shirlie Dove. She needs follow up in 4 weeks with repeat head CT ED 03/12/2024 subdural hematoma Please order CT, we will then contact the patient.

## 2024-03-13 NOTE — Telephone Encounter (Signed)
 Left message for Erica Walker her legal guardian.

## 2024-03-17 NOTE — Telephone Encounter (Signed)
 Left message to call back

## 2024-03-21 ENCOUNTER — Emergency Department

## 2024-03-21 ENCOUNTER — Emergency Department
Admission: EM | Admit: 2024-03-21 | Discharge: 2024-03-26 | Disposition: A | Discharge status: Skilled Nursing Facility | Attending: Emergency Medicine | Admitting: Emergency Medicine

## 2024-03-21 ENCOUNTER — Other Ambulatory Visit: Payer: Self-pay

## 2024-03-21 DIAGNOSIS — F03911 Unspecified dementia, unspecified severity, with agitation: Secondary | ICD-10-CM | POA: Diagnosis not present

## 2024-03-21 DIAGNOSIS — K219 Gastro-esophageal reflux disease without esophagitis: Secondary | ICD-10-CM | POA: Insufficient documentation

## 2024-03-21 DIAGNOSIS — E876 Hypokalemia: Secondary | ICD-10-CM | POA: Insufficient documentation

## 2024-03-21 DIAGNOSIS — Z9181 History of falling: Secondary | ICD-10-CM | POA: Insufficient documentation

## 2024-03-21 DIAGNOSIS — F0394 Unspecified dementia, unspecified severity, with anxiety: Secondary | ICD-10-CM | POA: Diagnosis not present

## 2024-03-21 DIAGNOSIS — E119 Type 2 diabetes mellitus without complications: Secondary | ICD-10-CM | POA: Diagnosis not present

## 2024-03-21 DIAGNOSIS — J449 Chronic obstructive pulmonary disease, unspecified: Secondary | ICD-10-CM | POA: Diagnosis not present

## 2024-03-21 DIAGNOSIS — R519 Headache, unspecified: Secondary | ICD-10-CM | POA: Insufficient documentation

## 2024-03-21 DIAGNOSIS — E2740 Unspecified adrenocortical insufficiency: Secondary | ICD-10-CM | POA: Insufficient documentation

## 2024-03-21 DIAGNOSIS — I10 Essential (primary) hypertension: Secondary | ICD-10-CM | POA: Diagnosis not present

## 2024-03-21 DIAGNOSIS — E0590 Thyrotoxicosis, unspecified without thyrotoxic crisis or storm: Secondary | ICD-10-CM | POA: Diagnosis not present

## 2024-03-21 DIAGNOSIS — F03918 Unspecified dementia, unspecified severity, with other behavioral disturbance: Secondary | ICD-10-CM | POA: Diagnosis not present

## 2024-03-21 DIAGNOSIS — D649 Anemia, unspecified: Secondary | ICD-10-CM | POA: Insufficient documentation

## 2024-03-21 DIAGNOSIS — R456 Violent behavior: Secondary | ICD-10-CM | POA: Diagnosis present

## 2024-03-21 DIAGNOSIS — R451 Restlessness and agitation: Secondary | ICD-10-CM

## 2024-03-21 MED ORDER — QUETIAPINE FUMARATE 25 MG PO TABS
100.0000 mg | ORAL_TABLET | Freq: Every day | ORAL | Status: DC
Start: 2024-03-21 — End: 2024-03-22
  Administered 2024-03-21: 100 mg via ORAL
  Filled 2024-03-21: qty 4

## 2024-03-21 MED ORDER — LORAZEPAM 2 MG/ML IJ SOLN
1.0000 mg | Freq: Once | INTRAMUSCULAR | Status: AC
  Administered 2024-03-21: 1 mg via INTRAMUSCULAR
  Filled 2024-03-21: qty 1

## 2024-03-21 MED ORDER — LORAZEPAM 2 MG/ML IJ SOLN: 0.5000 mg | Freq: Once | INTRAMUSCULAR | Status: DC

## 2024-03-21 MED ORDER — HALOPERIDOL LACTATE 5 MG/ML IJ SOLN
2.5000 mg | Freq: Once | INTRAMUSCULAR | Status: AC
  Administered 2024-03-21: 2.5 mg via INTRAMUSCULAR
  Filled 2024-03-21: qty 1

## 2024-03-21 NOTE — ED Notes (Signed)
 Pt kept pulling temperature probe out from under armpit whenever this tech or Noelle, EDT, would try to get a temperature. We also tried getting a temperature orally but the patient would not keep it under her tongue.

## 2024-03-21 NOTE — ED Provider Notes (Signed)
-----------------------------------------   3:24 AM on 03/22/2024 ----------------------------------------- Thus far patient's workup reassuring.  Urinalysis without evidence of infection, CBC without leukocytosis, chronic anemia noted.  There is mild hypokalemia without evidence of other acute abnormalities.  Valproate level appropriate.  Communicating with nursing staff to facilitate CT imaging head neck   Iver Marker, MD 03/22/24 [phone]

## 2024-03-21 NOTE — ED Notes (Signed)
 Patient is restless, pulling off clothes and brief, and trying to get out of bed. MD notified.

## 2024-03-21 NOTE — ED Notes (Signed)
Pt is currently resting at this time.  

## 2024-03-21 NOTE — ED Triage Notes (Addendum)
 Pt comes via EMS from [facility] with combative behavior. Pt was seen here recently for AMS and multiple falls. Pt has known head bleed. Pt combative with EMS. Pt has UTI also.  Pt has had ativan  twice today by facility and Depakote . Pt does have dementia.

## 2024-03-21 NOTE — ED Notes (Signed)
 Belynda Brace psychiatric NP at [facility] [phone]

## 2024-03-21 NOTE — ED Notes (Signed)
 CT, blood work, and urine unsuccessful to obtain due to patient being combative and uncooperative.

## 2024-03-21 NOTE — ED Notes (Signed)
 Pt is climbing out of bed, being combative and calling this RN a witch.

## 2024-03-21 NOTE — ED Provider Notes (Signed)
   [facility] Provider Note    Event Date/Time   First MD Initiated Contact with Patient 03/21/24 1811     (approximate)   History   Aggressive Behavior   HPI  Erica Walker is a 78 y.o. female who presents to the emergency department today because of concerns for abnormal and aggressive behavior.  Patient is coming from living facility.  Does have a history of dementia.  Apparently they tried to give medication at the facility which did not help with the patient's behavior.  Patient herself is agitated and unable to give any significant history.     Physical Exam   Triage Vital Signs: ED Triage Vitals  Encounter Vitals Group     BP 03/21/24 1750 (!) 129/113     Systolic BP Percentile --      Diastolic BP Percentile --      Pulse Rate 03/21/24 1750 60     Resp 03/21/24 1750 14     Temp --      Temp src --      SpO2 03/21/24 1750 96 %     Weight --      Height --      Head Circumference --      Peak Flow --      Pain Score 03/21/24 1737 0     Pain Loc --      Pain Education --      Exclude from Growth Chart --     Most recent vital signs: Vitals:   03/21/24 1750  BP: (!) 129/113  Pulse: 60  Resp: 14  SpO2: 96%   General: Awake, alert, agitated CV:  Good peripheral perfusion. Regular rate and rhythm. Resp:  Normal effort. Lungs clear. Abd:  No distention.    ED Results / Procedures / Treatments   Labs (all labs ordered are listed, but only abnormal results are displayed) Labs Reviewed  URINALYSIS, ROUTINE W REFLEX MICROSCOPIC  CBC WITH DIFFERENTIAL/PLATELET  BASIC METABOLIC PANEL WITH GFR     EKG  None   RADIOLOGY CT pending   PROCEDURES:  Critical Care performed: No  MEDICATIONS ORDERED IN ED: Medications  LORazepam  (ATIVAN ) injection 0.5 mg (has no administration in time range)     IMPRESSION / MDM / ASSESSMENT AND PLAN / ED COURSE  I reviewed the triage vital signs and the nursing notes.                               Differential diagnosis includes, but is not limited to, UTI, ICH, electrolyte abnormality  Patient's presentation is most consistent with acute presentation with potential threat to life or bodily function.   Patient presents to the emergency department today because of concerns for agitation and aggressive behavior.  Patient was seen in the emergency department about 10 days ago after a fall and was found to have subdural hematoma.  Patient is agitated here in the emergency department.  Attempting to get patient to be calm with multiple medications so workup can be started.  Would have concerns for possible recurrent a cranial hemorrhage, infection.      FINAL CLINICAL IMPRESSION(S) / ED DIAGNOSES   Final diagnoses:  Agitation     Note:  This document was prepared using Dragon voice recognition software and may include unintentional dictation errors.    Marylynn Soho, MD 03/21/24 [phone]

## 2024-03-21 NOTE — ED Notes (Signed)
 Pt is climbing out of bed and being combative with this sitter

## 2024-03-21 NOTE — ED Notes (Signed)
 Pt tried to pulled brief, hand mitts, and climb out of bed. Told pt she needed to try and get some sleep. Lights are off and pt is provided with blankets.

## 2024-03-22 ENCOUNTER — Emergency Department

## 2024-03-22 DIAGNOSIS — F03918 Unspecified dementia, unspecified severity, with other behavioral disturbance: Secondary | ICD-10-CM

## 2024-03-22 DIAGNOSIS — J449 Chronic obstructive pulmonary disease, unspecified: Secondary | ICD-10-CM | POA: Diagnosis not present

## 2024-03-22 LAB — URINALYSIS, ROUTINE W REFLEX MICROSCOPIC
Bacteria, UA: NONE SEEN
Bilirubin Urine: NEGATIVE
Glucose, UA: NEGATIVE mg/dL
Hgb urine dipstick: NEGATIVE
Ketones, ur: 5 mg/dL — AB
Leukocytes,Ua: NEGATIVE
Nitrite: NEGATIVE
Protein, ur: 30 mg/dL — AB
Specific Gravity, Urine: 1.025 (ref 1.005–1.030)
pH: 7 (ref 5.0–8.0)

## 2024-03-22 LAB — BASIC METABOLIC PANEL WITH GFR
Anion gap: 6 (ref 5–15)
BUN: 13 mg/dL (ref 8–23)
CO2: 30 mmol/L (ref 22–32)
Calcium: 8.3 mg/dL — ABNORMAL LOW (ref 8.9–10.3)
Chloride: 103 mmol/L (ref 98–111)
Creatinine, Ser: 0.96 mg/dL (ref 0.44–1.00)
GFR, Estimated: >60 mL/min (ref >60)
Glucose, Bld: 113 mg/dL — ABNORMAL HIGH (ref 70–99)
Potassium: 3.2 mmol/L — ABNORMAL LOW (ref 3.5–5.1)
Sodium: 139 mmol/L (ref 135–145)

## 2024-03-22 LAB — CBC WITH DIFFERENTIAL/PLATELET
Abs Immature Granulocytes: 0.03 10*3/uL (ref 0.00–0.07)
Basophils Absolute: 0 10*3/uL (ref 0.0–0.1)
Basophils Relative: 1 %
Eosinophils Absolute: 0.1 10*3/uL (ref 0.0–0.5)
Eosinophils Relative: 2 %
HCT: 29.4 % — ABNORMAL LOW (ref 36.0–46.0)
Hemoglobin: 10 g/dL — ABNORMAL LOW (ref 12.0–15.0)
Immature Granulocytes: 1 %
Lymphocytes Relative: 28 %
Lymphs Abs: 1.7 10*3/uL (ref 0.7–4.0)
MCH: 33.9 pg (ref 26.0–34.0)
MCHC: 34 g/dL (ref 30.0–36.0)
MCV: 99.7 fL (ref 80.0–100.0)
Monocytes Absolute: 0.7 10*3/uL (ref 0.1–1.0)
Monocytes Relative: 11 %
Neutro Abs: 3.6 10*3/uL (ref 1.7–7.7)
Neutrophils Relative %: 57 %
Platelets: 148 10*3/uL — ABNORMAL LOW (ref 150–400)
RBC: 2.95 MIL/uL — ABNORMAL LOW (ref 3.87–5.11)
RDW: 14 % (ref 11.5–15.5)
WBC: 6.2 10*3/uL (ref 4.0–10.5)
nRBC: 0 % (ref 0.0–0.2)

## 2024-03-22 LAB — VALPROIC ACID LEVEL: Valproic Acid Lvl: 61 ug/mL (ref 50–100)

## 2024-03-22 MED ORDER — OLANZAPINE 5 MG PO TABS
2.5000 mg | ORAL_TABLET | Freq: Three times a day (TID) | ORAL | Status: DC
  Administered 2024-03-22 – 2024-03-25 (×10): 2.5 mg via ORAL
  Filled 2024-03-22 (×10): qty 1

## 2024-03-22 MED ORDER — DIVALPROEX SODIUM 125 MG PO CSDR
125.0000 mg | DELAYED_RELEASE_CAPSULE | Freq: Three times a day (TID) | ORAL | Status: DC
Start: 2024-03-22 — End: 2024-03-26
  Administered 2024-03-22 – 2024-03-25 (×12): 125 mg via ORAL
  Filled 2024-03-22 (×13): qty 1

## 2024-03-22 MED ORDER — FUROSEMIDE 40 MG PO TABS
20.0000 mg | ORAL_TABLET | Freq: Every day | ORAL | Status: DC
  Administered 2024-03-22 – 2024-03-25 (×4): 20 mg via ORAL
  Filled 2024-03-22 (×4): qty 1

## 2024-03-22 MED ORDER — DONEPEZIL HCL 5 MG PO TABS
10.0000 mg | ORAL_TABLET | Freq: Every day | ORAL | Status: DC
  Administered 2024-03-22 – 2024-03-25 (×4): 10 mg via ORAL
  Filled 2024-03-22 (×4): qty 2

## 2024-03-22 MED ORDER — MEMANTINE HCL 5 MG PO TABS
10.0000 mg | ORAL_TABLET | Freq: Two times a day (BID) | ORAL | Status: DC
  Administered 2024-03-22 – 2024-03-25 (×8): 10 mg via ORAL
  Filled 2024-03-22 (×8): qty 2

## 2024-03-22 NOTE — ED Notes (Signed)
 Pt woke up at this time, Pt was able to take meds with applesauce. Pt needs to be reminded to not take off her brief or her mittens. Pt readjusted in bed, new brief placed

## 2024-03-22 NOTE — ED Notes (Signed)
 Pt is still sleeping, assessed Pts brief. It was dry. Holding all morning meds until Pt is alert enough to swallow. Sitter @bedside 

## 2024-03-22 NOTE — ED Notes (Signed)
 Tele consult in room.

## 2024-03-22 NOTE — ED Provider Notes (Signed)
 5:37 PM Assumed care for off going team.   Blood pressure 128/77, pulse 81, temperature (!) 97.4 F (36.3 C), temperature source Axillary, resp. rate 17, SpO2 100%.  See their HPI for full report but in brief pending psych.  Psych recommend NS clearance.  D/w Dr Mont Antis pt cleared with resolving subdurul.  Psych made some additional recommendations for psychiatric medications and can follow-up outpatient with neurology for her dementia.  We are going to see if she has improvement with the psychiatric medications that they prescribed.        Lubertha Rush, MD 03/22/24 [phone]

## 2024-03-22 NOTE — ED Provider Notes (Signed)
 CT HEAD WO CONTRAST ( ) Result Date: 03/22/2024 CLINICAL DATA:  Fall EXAM: CT HEAD WITHOUT CONTRAST CT CERVICAL SPINE WITHOUT CONTRAST TECHNIQUE: Multidetector CT imaging of the head and cervical spine was performed following the standard protocol without intravenous contrast. Multiplanar CT image reconstructions of the cervical spine were also generated. RADIATION DOSE REDUCTION: This exam was performed according to the departmental dose-optimization program which includes automated exposure control, adjustment of the mA and/or kV according to patient size and/or use of iterative reconstruction technique. COMPARISON:  03/12/2024 FINDINGS: CT HEAD FINDINGS Brain: No evidence of acute infarction, hydrocephalus, or mass lesion/mass effect. Trace subdural hematoma along the right tentorium (image 21), chronic/unchanged. Possible trace subdural hematoma along the right frontal convexity (coronal image 37), improved. Subcortical white matter and periventricular small vessel ischemic changes. Vascular: No hyperdense vessel or unexpected calcification. Skull: Normal. Negative for fracture or focal lesion. Sinuses/Orbits: The visualized paranasal sinuses are essentially clear. The mastoid air cells are unopacified. Other: None. CT CERVICAL SPINE FINDINGS Alignment: Normal. Skull base and vertebrae: No acute fracture. No primary bone lesion or focal pathologic process. Soft tissues and spinal canal: No prevertebral fluid or swelling. No visible canal hematoma. Disc levels: Intervertebral disc spaces are maintained. Spinal canal is patent. Upper chest: Visualized lung apices are clear. Other: None. IMPRESSION: Trace right subdural hematoma as above, improved. No traumatic injury to the cervical spine. Critical Value/emergent results were called by telephone at the time of interpretation on 03/22/2024 at 4:03 am to provider Dr Valetta Gaudy, who verbally acknowledged these results. Electronically Signed   By: Zadie Herter M.D.    On: 03/22/2024 04:03   CT Cervical Spine Wo Contrast Result Date: 03/22/2024 CLINICAL DATA:  Fall EXAM: CT HEAD WITHOUT CONTRAST CT CERVICAL SPINE WITHOUT CONTRAST TECHNIQUE: Multidetector CT imaging of the head and cervical spine was performed following the standard protocol without intravenous contrast. Multiplanar CT image reconstructions of the cervical spine were also generated. RADIATION DOSE REDUCTION: This exam was performed according to the departmental dose-optimization program which includes automated exposure control, adjustment of the mA and/or kV according to patient size and/or use of iterative reconstruction technique. COMPARISON:  03/12/2024 FINDINGS: CT HEAD FINDINGS Brain: No evidence of acute infarction, hydrocephalus, or mass lesion/mass effect. Trace subdural hematoma along the right tentorium (image 21), chronic/unchanged. Possible trace subdural hematoma along the right frontal convexity (coronal image 37), improved. Subcortical white matter and periventricular small vessel ischemic changes. Vascular: No hyperdense vessel or unexpected calcification. Skull: Normal. Negative for fracture or focal lesion. Sinuses/Orbits: The visualized paranasal sinuses are essentially clear. The mastoid air cells are unopacified. Other: None. CT CERVICAL SPINE FINDINGS Alignment: Normal. Skull base and vertebrae: No acute fracture. No primary bone lesion or focal pathologic process. Soft tissues and spinal canal: No prevertebral fluid or swelling. No visible canal hematoma. Disc levels: Intervertebral disc spaces are maintained. Spinal canal is patent. Upper chest: Visualized lung apices are clear. Other: None. IMPRESSION: Trace right subdural hematoma as above, improved. No traumatic injury to the cervical spine. Critical Value/emergent results were called by telephone at the time of interpretation on 03/22/2024 at 4:03 am to provider Dr Valetta Gaudy, who verbally acknowledged these results. Electronically Signed    By: Zadie Herter M.D.   On: 03/22/2024 04:03      Discussed CT head with radiologist.  Appears that the patient has improving imaging findings without worsening of subdural hematoma.   ----------------------------------------- 5:17 AM on 03/22/2024 ----------------------------------------- The patient is currently resting calmly.  No further agitation.  Respirations are even unlabored.  Vital signs very reassuring.  At this juncture, I have requested consult to psychiatry as was planned by Dr. Hendrick Locke as well.   Iver Marker, MD 03/22/24 [phone]

## 2024-03-22 NOTE — ED Notes (Signed)
Pt transported to CT by CT staff.  

## 2024-03-22 NOTE — ED Notes (Signed)
 Patient was found in her room with her brief pulled off and covers pulled off. Patient allowed RN to put another brief and blue bed pad on. RN instructed patient to take a nap because patient stated she was tired. Patient continued to pull at mitts applied on patient.

## 2024-03-22 NOTE — ED Notes (Signed)
 Assessed Pts LOC. Pt is able to respond slightly when spoken to but unable to follow commands such as open your eyes. Will reassess for psych to talk to patient. Sitter at bedside. New vital signs obtained

## 2024-03-22 NOTE — ED Notes (Signed)
Patient's family member at bedside. 

## 2024-03-22 NOTE — ED Notes (Signed)
 Changed patients brief. Moved patient into a hospital bed. Sitter at bedside.

## 2024-03-22 NOTE — ED Notes (Addendum)
 This tech assumed 1:1 sitter assignment. Patient is calm and resting currently, even and unlabored RR during observation. Bed is in low locked position and call bell within reach.

## 2024-03-22 NOTE — Consult Note (Cosign Needed Addendum)
 [facility] Psychiatric Consult Initial  Patient Name: .Leler Draper  MRN: [id number]  DOB: 1946-09-15  Consult Order details:  Orders (From admission, onward)     Start     Ordered   03/22/24 0515  CONSULT TO CALL ACT TEAM       Ordering Provider: Iver Marker, MD  Provider:  (Not yet assigned)  Question:  Reason for Consult?  Answer:  agitated behavior, medication recommendations for agitation and psych assessment   03/22/24 0514   03/22/24 0514  IP CONSULT TO PSYCHIATRY       Ordering Provider: Iver Marker, MD  Provider:  (Not yet assigned)  Question Answer Comment  Place call to: pscyh md or np   Reason for Consult Consult   Diagnosis/Clinical Info for Consult: agitated behavior, medication recommendations for agitation and psych assessment      03/22/24 0514             Mode of Visit: Tele-visit Virtual Statement:TELE PSYCHIATRY ATTESTATION & CONSENT As the provider for this telehealth consult, I attest that I verified the patient's identity using two separate identifiers, introduced myself to the patient, provided my credentials, disclosed my location, and performed this encounter via a HIPAA-compliant, real-time, face-to-face, two-way, interactive audio and video platform and with the full consent and agreement of the patient (or guardian as applicable.) Patient physical location: [facility]. Telehealth provider physical location: home office in state of [place].   Video start time: 1603 Video end time: 1621    Psychiatry Consult Evaluation  Service Date: March 22, 2024 LOS:  LOS: 0 days  Chief Complaint "they are horrible"  Primary Psychiatric Diagnoses  Dementia wit behavioral disturbances  Assessment  Sahasra Lucas is a 78 y.o. female admitted: Presented to the EDfor 03/21/2024  6:10 PM for evaluation of combative behaviors. She carries the psychiatric diagnoses of dementia and has a past medical history of  HTN, COPD, GERD, Hyperthyroidism, DM, Adrenal Insufficiency.   On  admission, patient was agitated, combative and unable to provide historical information.  Her behaviors are unpredictable as her progress notes earlier today shows she was calm and no longer agitated, however during assessment she is visibly agitated and combative towards nursing who are trying to care for her.     Patient has hx for dementia and confusion but not known to be combative. Per Sandy-director of nursing at [facility],  patient's combative behaviors, and mobility decline started following her fall on 4/9 in which she hit her head. Prior to this incident, she reported patient used to ambulate with her walker without concerns.  Patient was also evaluated on 4/16 following subsequent fall and dx with subdural hematoma; incidental dx of acute cystitis and hematuria.   She was treated with abx and as of 4/26 her urinalysis was negative.  ED Provider felt her metabolic workup was reassuring, no leukocytosis but chronic anemia, mild hypokalemia; repeat CT on 4/26 was discussed with radiology who felt there was some improvement in her images and no worsening subdural hematoma demonstrated.  Given, the ED attending did not believe the abnormalities contributed to her presentation, she was referred for psychiatry for assessment.   Her current presentation of irritability, confusing and inability to provide historical information or verbally participate in assessment, and mumbling is most consistent with dementia. She meets criteria for continued observation based on above.  Current outpatient psychotropic medications include divalproex  acid 125mg  po TID for mood stability(valproic acid levels therapeutic at 61ug/ml); donepezil  10mg  at bedtime and memantine  10mg   po BID for dementia and historically she has had a favorable response to these medications. She was compliant with medications prior to admission as evidenced by historical reports.   On initial examination, patient is observed laying in bed, fairly  groomed in hospital gown.  She's wearing gloves/mitts for safety and observed kicking and fighting hospital staff.   Patient is unable to state her name. At baseline she is confused and disoriented and offers limited input during assessment.  She's heard yelling, unintelligible words and then mumbling.  Intermittently she states, "okay" and is heard yelling "they are horrible" but unable or unwilling to offer clarification.  Verbal support and encouragement extended; pt informed she's in the hospital and safe.    Please see plan below for detailed recommendations.   Diagnoses:  Active Hospital problems: Principal Problem:   Dementia with behavioral disturbance (HCC)    Plan   ## Psychiatric Medication Recommendations:  Recommend d/c Seroquel  EKG shows QTC of 428, which is within limit to add Zyprexa 2.5mg  po TID for her agitation/combative behaviors. Aware there is a black box warning for atypical antipsychotics in elderly but appropriate given her extreme agitation and aggressive behaviors increasing her risk for further injury.  Continue depakote  125mg  po TID for mood stability  ## Medical Decision Making Capacity:  Patient has hx for dementia and unable to make her own medical decisions.  ## Further Work-up:  -- Recommend consulting neurosurgery regarding head CT and rule or trauma contribution to patient's presentation.  While pt on Qtc prolonging medications, please monitor & replete K+ to 4 and Mg2+ to 2 -- most recent EKG on 03/12/2024 had QtC of 428 -- Pertinent labwork reviewed earlier this admission includes: CMP, CBC, Head CT, U/A -Valproic Acid levels were 60ug/ml -therapeutic  ## Disposition:-- Patient with hx of dementia, which is progressive and may not improve with psychiatric admission. Therefore, recommend continued observation in the emergency department while psychiatry attempts to adjust her medications;daily monitoring for improvement in aggressive behaviors and  agitation.  [facility] with improved behaviors >48 hours.    ## Behavioral / Environmental: -Difficult Patient (SELECT OPTIONS FROM BELOW) or Utilize compassion and acknowledge the patient's experiences while setting clear and realistic expectations for care.    ## Safety and Observation Level:  - Based on my clinical evaluation, I estimate the patient to be at moderate risk of self harm in the current setting. - At this time, we recommend  1:1 Observation. This decision is based on my review of the chart including patient's history and current presentation, interview of the patient, mental status examination, and consideration of suicide risk including evaluating suicidal ideation, plan, intent, suicidal or self-harm behaviors, risk factors, and protective factors. This judgment is based on our ability to directly address suicide risk, implement suicide prevention strategies, and develop a safety plan while the patient is in the clinical setting. Please contact our team if there is a concern that risk level has changed.  CSSR Risk Category: unable to assess  Suicide Risk Assessment: Patient has following modifiable risk factors for suicide: recklessness, which we are addressing by recommending for psychotropic medication adjustments and psychiatry monitoring for improvement. Patient has following non-modifiable or demographic risk factors for suicide: hx of dementia Patient has the following protective factors against suicide: Access to outpatient mental health care and no history of suicide attempts  Thank you for this consult request. Recommendations have been communicated to the primary team.  We will continue  to monitor daily at this time.   Doneen Fuelling, NP       History of Present Illness  Relevant Aspects of Hospital ED Course:  Admitted on 03/21/2024 for  Per Rn Triage note dated 03/21/2024: "Pt comes via EMS from [facility] with combative behavior. Pt was seen  here recently for AMS and multiple falls. Pt has known head bleed. Pt combative with EMS. Pt has UTI also.   Pt has had ativan  twice today by facility and Depakote . Pt does have dementia."   Patient Report:  Patient is observed laying in bed, fairly groomed in hospital gown.  She's wearing gloves/mitts for safety and observed kicking and fighting hospital staff.   Patient is unable to state her name. At baseline she is confused and disoriented and offers limited input during assessment.  She's heard yelling, unintelligible words and then mumbling.  Intermittently she states, "okay" and is heard yelling "they are horrible" but unable or unwilling to offer clarification.  Verbal support and encouragement extended; pt informed she's in the hospital and safe.  Per ED Provider Admission Assessment 03/21/2024@1811 : Aggressive Behavior   HPI   Sherrel Reder is a 78 y.o. female who presents to the emergency department today because of concerns for abnormal and aggressive behavior.  Patient is coming from living facility.  Does have a history of dementia.  Apparently they tried to give medication at the facility which did not help with the patient's behavior.  Patient herself is agitated and unable to give any significant history.   Psych ROS:  Depression: unable to assess Anxiety:  yes, present Mania (lifetime and current): unable to assess Psychosis: (lifetime and current): unable to assess  Collateral information:  deferred  Review of Systems  Constitutional: Negative.   HENT: Negative.    Eyes: Negative.   Respiratory: Negative.    Cardiovascular: Negative.   Gastrointestinal: Negative.   Skin: Negative.   Endo/Heme/Allergies: Negative.   Psychiatric/Behavioral:  Positive for memory loss. The patient is nervous/anxious.      Psychiatric and Social History  Psychiatric History:  Information collected from patient, and chart review  Prev Dx/Sx: dementia Current Psych Provider:  Belynda Brace, Psychiatric NP at [facility] Meds (current): as listed below Previous Med Trials: deferred Therapy: pt has cognitive impairment and unable to engage in talk therapy.  Prior Psych Hospitalization: deferred  Prior Self Harm: unknown, pt unable to provide information d/t impaired cognition Prior Violence: pt has hx for combative behaviors   Family Psych History: unknown Family Hx suicide: unknown  Social History:  Developmental Hx: unknown Educational Hx: unknown Occupational Hx: unknown Legal Hx: unknown Living Situation: currently resides at [facility] facility Spiritual Hx: unknown Access to weapons/lethal means: no, pt lives in controlled environment.   Substance History Alcohol: per chart review, no  Tobacco: no Illicit drugs: no Prescription drug abuse: no Rehab hx: unknown  Exam Findings  Physical Exam:  Vital Signs:  Temp:  [97.4 F (36.3 C)-97.7 F (36.5 C)] 97.4 F (36.3 C) (04/26 1705) Pulse Rate:  [75-124] 81 (04/26 1705) Resp:  [14-17] 17 (04/26 1705) BP: (128-139)/(54-78) 128/77 (04/26 1705) SpO2:  [94 %-100 %] 100 % (04/26 1705) Blood pressure 128/77, pulse 81, temperature (!) 97.4 F (36.3 C), temperature source Axillary, resp. rate 17, SpO2 100%. There is no height or weight on file to calculate BMI.  Physical Exam  Mental Status Exam: General Appearance: Bizarre, Disheveled, and pt agitated and observed kicking and fighting hospital staff  Orientation:  Other:  she has dementia and is confused at baseline  Memory:  Other:  she has dementia and is confused at baseline  Concentration:  Concentration: Poor and Attention Span: Poor  Recall:  Poor  Attention  Poor  Eye Contact:  None  Speech:  Clear and Coherent, Garbled, and patient speech is clear and coherent and then becomes garbled and she mumbles  Language:  Poor  Volume:  Increased  Mood: agitated  Affect:  Blunt and Congruent  Thought Process:  Disorganized  Thought  Content:  Illogical  Suicidal Thoughts:     Other:  she has dementia and is confused at baseline; unable to unwilling to answer question  Homicidal Thoughts:   Other:  she has dementia and is confused at baseline  Judgement:  Poor  Insight:  Lacking  Psychomotor Activity:  Increased  Akathisia:  No  Fund of Knowledge:  Poor      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  Family History:  The patient's family history includes CAD in her father.  Medical History: Past Medical History:  Diagnosis Date   Acid reflux    Anxiety    Dementia (HCC)    Depression    Hypertension     Surgical History: Past Surgical History:  Procedure Laterality Date   CHOLECYSTECTOMY     EYE SURGERY     gallstones     HIP ARTHROPLASTY Left 09/25/2019   Procedure: ARTHROPLASTY BIPOLAR HIP (HEMIARTHROPLASTY);  Surgeon: Jerlyn Moons, MD;  Location: [facility] ORS;  Service: Orthopedics;  Laterality: Left;     Medications:   Current Facility-Administered Medications:    divalproex  (DEPAKOTE  SPRINKLE) capsule 125 mg, 125 mg, Oral, TID, Quale, Mark, MD, 125 mg at 03/22/24 1543   donepezil  (ARICEPT ) tablet 10 mg, 10 mg, Oral, QHS, Iver Marker, MD   furosemide (LASIX) tablet 20 mg, 20 mg, Oral, Daily, Quale, Mark, MD, 20 mg at 03/22/24 1307   memantine  (NAMENDA ) tablet 10 mg, 10 mg, Oral, BID, Iver Marker, MD, 10 mg at 03/22/24 1307   OLANZapine (ZYPREXA) tablet 2.5 mg, 2.5 mg, Oral, TID, Beckie Viscardi E, NP  Current Outpatient Medications:    acetaminophen  (TYLENOL ) 500 MG tablet, Take 500 mg by mouth., Disp: , Rfl:    alum & mag hydroxide-simeth (MAALOX/MYLANTA) 200-200-20 MG/5ML suspension, Take 30 mLs by mouth every 4 (four) hours as needed for indigestion. (Patient not taking: Reported on 11/01/2023),  Disp: 355 mL, Rfl: 0   aspirin EC 81 MG tablet, Take 81 mg by mouth daily. , Disp: , Rfl:    atenolol  (TENORMIN ) 25 MG tablet, Take 0.5 tablets (12.5 mg total) by mouth daily. (Patient not taking: Reported on 11/01/2023), Disp: 30 tablet, Rfl: 0   bisacodyl  (DULCOLAX) 10 MG suppository, Place 1 suppository (10 mg total) rectally daily as needed for moderate constipation., Disp: 12 suppository, Rfl: 0   buPROPion  (WELLBUTRIN  XL) 150 MG 24 hr tablet, Take 150 mg by mouth daily. (Patient not taking: Reported on 11/01/2023), Disp: , Rfl:    citalopram (CELEXA) 10 MG tablet, Take by mouth., Disp: , Rfl:    divalproex  (DEPAKOTE  SPRINKLE) 125 MG capsule, Take 125 mg by mouth 3 (three) times daily., Disp: , Rfl:    docusate sodium  (COLACE) 100 MG capsule, Take  1 capsule (100 mg total) by mouth 2 (two) times daily., Disp: 10 capsule, Rfl: 0   donepezil  (ARICEPT ) 10 MG tablet, Take 10 mg by mouth at bedtime. , Disp: , Rfl:    enoxaparin  (LOVENOX ) 40 MG/0.4ML injection, Inject 0.4 mLs (40 mg total) into the skin daily for 7 days. (Patient not taking: Reported on 11/01/2023), Disp: 5 mL, Rfl: 0   fluticasone  (FLONASE ) 50 MCG/ACT nasal spray, Two sprays in each nostril once daily., Disp: , Rfl:    furosemide (LASIX) 20 MG tablet, Take 20 mg by mouth daily., Disp: , Rfl:    losartan  (COZAAR ) 50 MG tablet, Take 50 mg by mouth daily. , Disp: , Rfl:    memantine  (NAMENDA ) 10 MG tablet, Take 10 mg by mouth 2 (two) times daily., Disp: , Rfl:    mineral oil-hydrophilic petrolatum (AQUAPHOR) ointment, Apply 1 Application topically as needed., Disp: , Rfl:    mirabegron  ER (MYRBETRIQ ) 25 MG TB24 tablet, Take 1 tablet (25 mg total) by mouth daily., Disp: 90 tablet, Rfl: 0   Multiple Vitamins-Minerals (MULTIVITAMIN WITH MINERALS) tablet, Take by mouth., Disp: , Rfl:    oxymetazoline  (DRISTAN SPRAY) 0.05 % nasal spray, 1 spray by Each Nare route daily as needed. (Patient not taking: Reported on 11/01/2023), Disp: , Rfl:     potassium chloride  (MICRO-K ) 10 MEQ CR capsule, Take 10 mEq by mouth daily., Disp: , Rfl:    QUEtiapine  (SEROQUEL ) 100 MG tablet, Take 100 mg by mouth at bedtime. , Disp: , Rfl:    simvastatin  (ZOCOR ) 20 MG tablet, Take 20 mg by mouth., Disp: , Rfl:    valACYclovir (VALTREX) 1000 MG tablet, , Disp: , Rfl: 0  Allergies: Allergies  Allergen Reactions   Other Rash    Allergy to Bel-tab    Doneen Fuelling, NP

## 2024-03-22 NOTE — Consult Note (Signed)
 This Clinical research associate and Kirstie Percy attempted to see patient or psychiatric assessment via tts cart.  Per Meghan Notch,RN caring for her, she received haldol  for agitation last night and remains asleep.  Per chart review, patient was agitated and combative, refusing care when she arrived on 4/25.  Will allow to rest and plan for psych assessment when she wakes up.  She has a hx for dementia, sleep is therapeutic.

## 2024-03-22 NOTE — ED Provider Notes (Signed)
 Ongoing care and disposition of the patient assigned to Dr. Azalee Bolds.    Iver Marker, MD 03/22/24 [phone]

## 2024-03-22 NOTE — ED Notes (Signed)
 Breakfast tray and beverage delivered. Patient is still resting, food was placed on bedside table.

## 2024-03-22 NOTE — ED Notes (Addendum)
 This tech and RN Meghan checked patients brief to see if it needed changing. Patients brief, chux, and bedding are dry at this time. Patient is currently sleeping. Will continue to 1:1 monitor patient.

## 2024-03-23 DIAGNOSIS — J449 Chronic obstructive pulmonary disease, unspecified: Secondary | ICD-10-CM | POA: Diagnosis not present

## 2024-03-23 NOTE — ED Notes (Signed)
 Pt continues to rest with eyes closed, RR equal and unlabored, chest rise and fall noted.

## 2024-03-23 NOTE — ED Notes (Signed)
 This tech as 1:1 sitter at this time. Pt is resting with eyed closed, RR equal and unlabored at this time, chest rise and fall noted.

## 2024-03-23 NOTE — ED Notes (Signed)
 This RN attempted to get vitals on pt and pt hit this RN in the chest.

## 2024-03-23 NOTE — ED Notes (Signed)
 Pt restless and agitated, attempting to exit the bed and take off mittens. This NT has attempted to redirect with no success. Pt attempted to hit both RN and NT numerous times while attempting to update vitals. Purewick self removed by pt. Pt checked for wetness at 20:30, pt wet. This NT and RN Marliss Simple changed brief, underpad, and performed pericare. Pt now relaxed and resting comfortably at this time.

## 2024-03-23 NOTE — ED Notes (Signed)
 Pt attempting to take off mittens and threw all her blankets on the ground.

## 2024-03-23 NOTE — ED Notes (Signed)
 1:1 sitter at bedside, pt currently sleeping NAD at this time

## 2024-03-23 NOTE — ED Notes (Signed)
 Pt visitor here, Mariah Shines LG.

## 2024-03-23 NOTE — ED Notes (Signed)
 Pt checked for wetness, pt dry at this time, pure wick in place. Pt stating does not need to void at this time. EDT [place] stating she offered dinner to pt but pt is refusing at this time. Pt asking to lay on right side, repositioned by this RN and EDT [place].

## 2024-03-23 NOTE — ED Notes (Signed)
 Pt checked for wetness, pt dry at this time.

## 2024-03-23 NOTE — ED Notes (Signed)
 Pt noted to be wet, brief changed, new linens placed, chuck pad placed. Per EDT [place], pt still trying to pull off mittens and IV line. Pt repositioned in bed and given new warm blanket, lights turned down, pt resting comfortably at this time.

## 2024-03-23 NOTE — ED Notes (Signed)
 Awaiting patient to wake up to obtain vital signs, as to not agitate patient- hx of agitation and being uncooperative.

## 2024-03-23 NOTE — ED Notes (Signed)
 VOL  per psych watch and redo medicines possible discharge to [facility] when improved.

## 2024-03-23 NOTE — ED Notes (Signed)
 Pt awake, appears restless in bed messing with blanket and mittens. Pt is pleasant at this time and is easily redirectable when attempting to pull mittens off.

## 2024-03-23 NOTE — ED Notes (Addendum)
 Pt restless in bed and attempting to take mittens off and get out of bed. Pt is easily redirectable at this time.This tech and Leisure centre manager checked pt's brief. Pt noted to be dry and clean. This tech also offered pt some dinner and pt denied anything to eat or drink at this time. Sitter remains at bedside.

## 2024-03-23 NOTE — ED Notes (Signed)
 Pt brief changed, new chuck pad placed. Pt given new mittens due to being soiled.

## 2024-03-23 NOTE — ED Provider Notes (Signed)
 Emergency Medicine Observation Re-evaluation Note  Physical Exam   BP 120/74   Pulse 92   Temp 98.2 F (36.8 C) (Axillary)   Resp 18   SpO2 98%   Patient appears in no acute distress.  ED Course / MDM   No reported events during my shift at the time of this note.   Pt is awaiting dispo from consultants   Buell Carmin MD    Buell Carmin, MD 03/23/24 [phone]

## 2024-03-23 NOTE — ED Notes (Signed)
 LG Mariah Shines called for update, HIPAA complaint VM left.

## 2024-03-24 DIAGNOSIS — F03918 Unspecified dementia, unspecified severity, with other behavioral disturbance: Secondary | ICD-10-CM

## 2024-03-24 DIAGNOSIS — J449 Chronic obstructive pulmonary disease, unspecified: Secondary | ICD-10-CM | POA: Diagnosis not present

## 2024-03-24 NOTE — ED Notes (Signed)
 Vol/consult done/recommend continued observation while psychiatry attempts to adjust her medications;daily monitoring for improvement in aggressive behaviors and agitation. Plan to discharge [facility] to [facility] with improved behaviors.

## 2024-03-24 NOTE — ED Notes (Signed)
 PT  VOL

## 2024-03-24 NOTE — ED Notes (Signed)
 This tech took over as 1:1 sitter, patient is calm, fidgeting with hands, has company at the bedside she is interacting with.

## 2024-03-24 NOTE — ED Notes (Addendum)
 Erica Walker

## 2024-03-24 NOTE — ED Notes (Signed)
 This tech attempted to feed patient again. Patient at one grape and one piece of cantelope. Refused all other food. Patient did drink some tea.

## 2024-03-24 NOTE — ED Notes (Signed)
 Patient's friend visiting at bedside at this time. Mansfield Seip EDT remains at bedside for 1:1 sitter observation.

## 2024-03-24 NOTE — ED Notes (Addendum)
 [facility] spoke to [place], Charity fundraiser for an update on patient's behavior.  Carolynne Citron, RN reported that pt's behavior has improved since last night.  Carolynne Citron, RN and staff will update chart objectively to show progression in pt's behavior throughout this shift.  Pt was recommended to be re-evaluated by psych provider this morning.  Macky Sayres, [facility]

## 2024-03-24 NOTE — ED Notes (Signed)
 Dinner tray brought to patient. This tech got patients tray ready for her to eat, but patient refused food. This tech was able to hand feed patient one piece of fruit. Patient refused a second bite at this time. Water and tea was offered to drink, patient refused both. Will try to get patient to take more food later.

## 2024-03-24 NOTE — ED Provider Notes (Signed)
 Emergency Medicine Observation Re-evaluation Note  Erica Walker is a 78 y.o. female, seen on rounds today.  Pt initially presented to the ED for complaints of Aggressive Behavior Currently, the patient is resting, voices no medical complaints.  Physical Exam  BP (!) 142/68 (BP Location: Right Arm)   Pulse 72   Temp 98.1 F (36.7 C) (Axillary)   Resp 16   SpO2 97%  Physical Exam General: Resting in no acute distress Cardiac: No cyanosis Lungs: Equal rise and fall Psych: Not agitated  ED Course / MDM  EKG:   I have reviewed the labs performed to date as well as medications administered while in observation.  Recent changes in the last 24 hours include no events overnight.  Plan  Current plan is for psychiatric disposition.    Swathi Dauphin J, MD 03/24/24 [phone]

## 2024-03-24 NOTE — ED Notes (Signed)
 Pt calm and cooperative for this RN after sleeping well through the night. Patient stating she would like breakfast, sitter setting up breakfast at this time.

## 2024-03-24 NOTE — BH Assessment (Signed)
 Psych Team met with patient for reassessment. Patient presents lying in bed with sitter at bedside. Patient is calm, cooperative and pleasant. Patient is not oriented to place, name or situation. When asked any questions, patient responds with random responses about her children and husband. Patient is smiling yet offers limited input during interview.   Per Alaine Howells, NP, patient does not meet criteria for inpatient psychiatric admission and can return to nursing facility.

## 2024-03-24 NOTE — ED Notes (Signed)
 Patient resting comfortably at this time with no complaints/aggressive behaviors.

## 2024-03-24 NOTE — Consult Note (Signed)
 [facility] {CHL [facility] INITIAL OR FOLLOW ZO:10960}  Patient Name: .Erica Walker  MRN: [id number]  DOB: November 17, 1946  Consult Order details:  Orders (From admission, onward)     Start     Ordered   03/22/24 0515  CONSULT TO CALL ACT TEAM       Ordering Provider: Iver Marker, MD  Provider:  (Not yet assigned)  Question:  Reason for Consult?  Answer:  agitated behavior, medication recommendations for agitation and psych assessment   03/22/24 0514   03/22/24 0514  IP CONSULT TO PSYCHIATRY       Ordering Provider: Iver Marker, MD  Provider:  (Not yet assigned)  Question Answer Comment  Place call to: pscyh md or np   Reason for Consult Consult   Diagnosis/Clinical Info for Consult: agitated behavior, medication recommendations for agitation and psych assessment      03/22/24 0514             Mode of Visit: {Type of visit:31911}    Psychiatry Consult Evaluation  Service Date: March 24, 2024 LOS:  LOS: 0 days  Chief Complaint ***  Primary Psychiatric Diagnoses  *** 2.  *** 3.  ***  Assessment  Erica Walker is a 78 y.o. female admitted: {CHL [facility] Medical or Presented to JY:78295}AOZ 03/21/2024  6:10 PM for ***. She carries the psychiatric diagnoses of *** and has a past medical history of  ***.   Her current presentation of *** is most consistent with ***. She meets criteria for *** based on ***.  Current outpatient psychotropic medications include *** and historically she has had a *** response to these medications. She was *** compliant with medications prior to admission as evidenced by ***. On initial examination, patient ***. Please see plan below for detailed recommendations.   Diagnoses:  Active Hospital problems: Principal Problem:   Dementia with behavioral disturbance (HCC)    Plan   ## Psychiatric Medication Recommendations:  ***  ## Medical Decision Making Capacity: {CHL BH MEDICAL DECISION MAKING CAPACITY:31818}  ## Further Work-up:   -- *** {CHLmacgeneralandspecificworkuprecs:31821} -- most recent EKG on *** had QtC of *** -- Pertinent labwork reviewed earlier this admission includes: ***   ## Disposition:-- {CHLmaccldispo:31820}  ## Behavioral / Environmental: -{CHLmacbehavioralenvironmental2:31847}    ## Safety and Observation Level:  - Based on my clinical evaluation, I estimate the patient to be at *** risk of self harm in the current setting. - At this time, we recommend  {CHL BH SUICIDE OBSERVATION LEVEL:31850}. This decision is based on my review of the chart including patient's history and current presentation, interview of the patient, mental status examination, and consideration of suicide risk including evaluating suicidal ideation, plan, intent, suicidal or self-harm behaviors, risk factors, and protective factors. This judgment is based on our ability to directly address suicide risk, implement suicide prevention strategies, and develop a safety plan while the patient is in the clinical setting. Please contact our team if there is a concern that risk level has changed.  CSSR Risk Category:   Suicide Risk Assessment: Patient has following modifiable risk factors for suicide: {CHLmacmodifiablesuicideriskfactors:31822}, which we are addressing by ***. Patient has following non-modifiable or demographic risk factors for suicide: {CHLmacnonmodifiablesuicideriskfactors:31823} Patient has the following protective factors against suicide: {CHLmacprotectivefactors:31824}  Thank you for this consult request. Recommendations have been communicated to the primary team.  We will *** at this time.   Erica Antigua, NP       History of Present Illness  Relevant Aspects of Hospital [facility] or ED course:31819} Course:  Admitted on 03/21/2024 for ***. They ***.   Patient Report:  ***  Psych ROS:  Depression: *** Anxiety:  *** Mania (lifetime and current): *** Psychosis: (lifetime and current):  ***  Collateral information:  Contacted *** at *** on ***  ROS   Psychiatric and Social History  Psychiatric History:  Information collected from ***  Prev Dx/Sx: *** Current Psych Provider: *** Home Meds (current): *** Previous Med Trials: *** Therapy: ***  Prior Psych Hospitalization: ***  Prior Self Harm: *** Prior Violence: ***  Family Psych History: *** Family Hx suicide: ***  Social History:  Developmental Hx: *** Educational Hx: *** Occupational Hx: *** Legal Hx: *** Living Situation: *** Spiritual Hx: *** Access to weapons/lethal means: ***   Substance History Alcohol: ***  Type of alcohol *** Last Drink *** Number of drinks per day *** History of alcohol withdrawal seizures *** History of DT's *** Tobacco: *** Illicit drugs: *** Prescription drug abuse: *** Rehab hx: ***  Exam Findings  Physical Exam: *** Vital Signs:  Temp:  [98 F (36.7 C)-98.1 F (36.7 C)] 98.1 F (36.7 C) (04/28 0250) Pulse Rate:  [72] 72 (04/28 0250) Resp:  [16] 16 (04/28 0250) BP: (142)/(68) 142/68 (04/28 0250) SpO2:  [97 %] 97 % (04/28 0250) Blood pressure (!) 142/68, pulse 72, temperature 98.1 F (36.7 C), temperature source Axillary, resp. rate 16, SpO2 97%. There is no height or weight on file to calculate BMI.  Physical Exam  Mental Status Exam: General Appearance: {Appearance:22683}  Orientation:  {BHH ORIENTATION (PAA):22689}  Memory:  {BHH MEMORY:22881}  Concentration:  {Concentration:21399}  Recall:  {BHH GOOD/FAIR/POOR:22877}  Attention  {BH Attention Span:31825}  Eye Contact:  {BHH EYE CONTACT:22684}  Speech:  {Speech:22685}  Language:  {BHH GOOD/FAIR/POOR:22877}  Volume:  {Volume (PAA):22686}  Mood: ***  Affect:  {Affect (PAA):22687}  Thought Process:  {Thought Process (PAA):22688}  Thought Content:  {Thought Content:22690}  Suicidal Thoughts:  {ST/HT (PAA):22692}  Homicidal Thoughts:  {ST/HT (PAA):22692}  Judgement:  {Judgement (PAA):22694}   Insight:  {Insight (PAA):22695}  Psychomotor Activity:  {Psychomotor (PAA):22696}  Akathisia:  {BHH YES OR NO:22294}  Fund of Knowledge:  {BHH GOOD/FAIR/POOR:22877}      Assets:  {Assets (PAA):22698}  Cognition:  {chl bhh cognition:304700322}  ADL's:  {BHH WUJ'W:11914}  AIMS (if indicated):        Other History   These have been pulled in through the EMR, reviewed, and updated if appropriate.  Family History:  The patient's family history includes CAD in her father.  Medical History: Past Medical History:  Diagnosis Date  . Acid reflux   . Anxiety   . Dementia (HCC)   . Depression   . Hypertension     Surgical History: Past Surgical History:  Procedure Laterality Date  . CHOLECYSTECTOMY    . EYE SURGERY    . gallstones    . HIP ARTHROPLASTY Left 09/25/2019   Procedure: ARTHROPLASTY BIPOLAR HIP (HEMIARTHROPLASTY);  Surgeon: Jerlyn Moons, MD;  Location: [facility] ORS;  Service: Orthopedics;  Laterality: Left;     Medications:   Current Facility-Administered Medications:  .  divalproex  (DEPAKOTE  SPRINKLE) capsule 125 mg, 125 mg, Oral, TID, Quale, Mark, MD, 125 mg at 03/24/24 1120 .  donepezil  (ARICEPT ) tablet 10 mg, 10 mg, Oral, QHS, Quale, Lavonia Powers, MD, 10 mg at 03/23/24 2118 .  furosemide (LASIX) tablet 20 mg, 20 mg, Oral, Daily, Quale, Mark, MD, 20 mg at 03/24/24 1119 .  memantine  (NAMENDA ) tablet 10 mg, 10 mg, Oral, BID, Quale, Mark, MD, 10 mg at 03/24/24 1120 .  OLANZapine (ZYPREXA) tablet 2.5 mg, 2.5 mg, Oral, TID, Mills, Shnese E, NP, 2.5 mg at 03/24/24 1119  Current Outpatient Medications:  .  acetaminophen  (TYLENOL ) 500 MG tablet, Take 500 mg by mouth., Disp: , Rfl:  .  alum & mag hydroxide-simeth (MAALOX/MYLANTA) 200-200-20 MG/5ML suspension, Take 30 mLs by mouth every 4 (four) hours as needed for indigestion. (Patient not taking: Reported on 11/01/2023), Disp: 355 mL, Rfl: 0 .  aspirin EC 81 MG tablet, Take 81 mg by mouth daily. , Disp: , Rfl:  .  atenolol   (TENORMIN ) 25 MG tablet, Take 0.5 tablets (12.5 mg total) by mouth daily. (Patient not taking: Reported on 11/01/2023), Disp: 30 tablet, Rfl: 0 .  bisacodyl  (DULCOLAX) 10 MG suppository, Place 1 suppository (10 mg total) rectally daily as needed for moderate constipation., Disp: 12 suppository, Rfl: 0 .  buPROPion  (WELLBUTRIN  XL) 150 MG 24 hr tablet, Take 150 mg by mouth daily. (Patient not taking: Reported on 11/01/2023), Disp: , Rfl:  .  citalopram (CELEXA) 10 MG tablet, Take by mouth., Disp: , Rfl:  .  divalproex  (DEPAKOTE  SPRINKLE) 125 MG capsule, Take 125 mg by mouth 3 (three) times daily., Disp: , Rfl:  .  docusate sodium  (COLACE) 100 MG capsule, Take 1 capsule (100 mg total) by mouth 2 (two) times daily., Disp: 10 capsule, Rfl: 0 .  donepezil  (ARICEPT ) 10 MG tablet, Take 10 mg by mouth at bedtime. , Disp: , Rfl:  .  enoxaparin  (LOVENOX ) 40 MG/0.4ML injection, Inject 0.4 mLs (40 mg total) into the skin daily for 7 days. (Patient not taking: Reported on 11/01/2023), Disp: 5 mL, Rfl: 0 .  fluticasone  (FLONASE ) 50 MCG/ACT nasal spray, Two sprays in each nostril once daily., Disp: , Rfl:  .  furosemide (LASIX) 20 MG tablet, Take 20 mg by mouth daily., Disp: , Rfl:  .  losartan  (COZAAR ) 50 MG tablet, Take 50 mg by mouth daily. , Disp: , Rfl:  .  memantine  (NAMENDA ) 10 MG tablet, Take 10 mg by mouth 2 (two) times daily., Disp: , Rfl:  .  mineral oil-hydrophilic petrolatum (AQUAPHOR) ointment, Apply 1 Application topically as needed., Disp: , Rfl:  .  mirabegron  ER (MYRBETRIQ ) 25 MG TB24 tablet, Take 1 tablet (25 mg total) by mouth daily., Disp: 90 tablet, Rfl: 0 .  Multiple Vitamins-Minerals (MULTIVITAMIN WITH MINERALS) tablet, Take by mouth., Disp: , Rfl:  .  oxymetazoline  (DRISTAN SPRAY) 0.05 % nasal spray, 1 spray by Each Nare route daily as needed. (Patient not taking: Reported on 11/01/2023), Disp: , Rfl:  .  potassium chloride  (MICRO-K ) 10 MEQ CR capsule, Take 10 mEq by mouth daily., Disp: , Rfl:   .  QUEtiapine  (SEROQUEL ) 100 MG tablet, Take 100 mg by mouth at bedtime. , Disp: , Rfl:  .  simvastatin  (ZOCOR ) 20 MG tablet, Take 20 mg by mouth., Disp: , Rfl:  .  valACYclovir (VALTREX) 1000 MG tablet, , Disp: , Rfl: 0  Allergies: Allergies  Allergen Reactions  . Other Rash    Allergy to Bel-tab    Erica Antigua, NP

## 2024-03-24 NOTE — ED Notes (Signed)
 This RN spoke to the Associate Professor at [facility] (phone number: [phone]) to confirm that the patient will be accepted back at the facility following discharge at the hospital. The unit manager said that our staff will need to talk to either the director of nursing or the administrator at [facility], who will not be back until the morning. This RN was asked to return the call in the morning to confirm that the patient will be accepted back. MD Peggi Bowels and psych team made aware.

## 2024-03-24 NOTE — ED Notes (Addendum)
 Patient took pills calmly

## 2024-03-24 NOTE — ED Notes (Signed)
 Pt fidgeting with side rails in attempt to exit the bed, still remains cooperative and easy to redirect.

## 2024-03-24 NOTE — ED Notes (Signed)
 This NT took over as 1:1 sitter, received handoff from NT [place]. Pt relaxing and comfortable in hospital bed.

## 2024-03-24 NOTE — ED Notes (Signed)
 Patient continues to rest in bed comfortably at this time. Patient fidgeting with safety mittens. Patient remains calm and cooperative with staff.

## 2024-03-24 NOTE — ED Notes (Signed)
 Patient is resting comfortably.

## 2024-03-24 NOTE — Telephone Encounter (Signed)
 Patient is currently in the ED.

## 2024-03-24 NOTE — ED Notes (Signed)
 This tech checked patients brief due to patient stating she had to urinate several times. Patients brief is dry at this time.

## 2024-03-25 DIAGNOSIS — J449 Chronic obstructive pulmonary disease, unspecified: Secondary | ICD-10-CM | POA: Diagnosis not present

## 2024-03-25 NOTE — ED Notes (Signed)
 Pt consumed a few bites of meal tray, 1/2 cup applesauce, 100% of (1) Ensure

## 2024-03-25 NOTE — ED Notes (Signed)
 Message sent to pharmacy for missing Depakote  dose

## 2024-03-25 NOTE — Consult Note (Signed)
 Spoke with Margarete Sharps and [place], SW and Merrily Able at [facility]. They are aware of patient's status, and psych clearance. They informed this Clinical research associate that [facility] requires 24 hours free from restraints and sitters before patient can return to their facility.   [facility] does not have a locked memory care unit. All their patients live together.  Given the events tat led to patient's admission, they jointly expressed concerns if patient remains appropriate for their program. This writer did express, Pt with hx dementia, with periodic behavioral disturbance. Prior ED visits eval for same. Unfortunately, this is not 'curable', pt should be expected to have episodic/periodic behavioral symptoms.   This Clinical research associate did share the importance of safety for the patient and other residents at [facility]. Additionally, requested they communicate additional concerns so we could address them real time to prepare for her discharge tomorrow. No other concerns voiced.

## 2024-03-25 NOTE — ED Provider Notes (Signed)
-----------------------------------------   4:04 AM on 03/25/2024 -----------------------------------------   Blood pressure 134/68, pulse 89, temperature 98.1 F (36.7 C), temperature source Oral, resp. rate 16, SpO2 98%.  The patient is calm and cooperative at this time.  There have been no acute events since the last update.  Awaiting disposition plan from case management/social work.    Makaylen Thieme, Clover Dao, DO 03/25/24 [phone]

## 2024-03-25 NOTE — ED Notes (Signed)
 Pt. Is awake at this time and continues to fidget with blankets.

## 2024-03-25 NOTE — ED Notes (Signed)
Sitter with pt

## 2024-03-25 NOTE — ED Notes (Signed)
 Breakfast tray left at bedside. Pt resting at this time.

## 2024-03-25 NOTE — Consult Note (Signed)
 Attempted to reach Belynda Brace, Psych NP at [facility] to discuss patient's current status and psychiatric clearance.  Message left requesting return call to this writer.

## 2024-03-25 NOTE — ED Notes (Signed)
 Pt diet order changed to mechanical soft, missing teeth prevent her from chewing

## 2024-03-25 NOTE — ED Notes (Signed)
 Pt bed linens, brief, and blanket changed, small BM

## 2024-03-25 NOTE — Consult Note (Signed)
 Received return call from Belynda Brace, Psych NP at [facility].  She's updated regarding patient treatment plan including improvement in aggressive behaviors, compliance with medications and psychiatric clearance as of 03/24/2024. NP Manual Self is unable to make decisions regarding if patient can return to [facility].  She thanked this Clinical research associate for the update and will forward my contact information to [facility] DON for return call.

## 2024-03-25 NOTE — ED Notes (Signed)
 Patient's brief was found to be soiled.  The patient was cleaned, her brief and linens were changed.  Patient was also repositioned in the bed.  She kept discussing that she wanted to go home, and remembering things that she needed to do around the house.  She is hopeful that her husband and children will be able to handle things without her there until she can get back.

## 2024-03-25 NOTE — ED Notes (Signed)
 Pt consumed 100% of (1) applesauce container

## 2024-03-25 NOTE — ED Notes (Addendum)
 Brief and chux pad changed, pt repositioned in bed, pt ate 3 bites of breakfast potatoes, drank 1/2 OJ, refuses additional food/drink at this time

## 2024-03-25 NOTE — ED Notes (Signed)
 Secure chat rcvd pt must be out of hand mitts and sitter free x 24 hours in order to return to [facility], no sitter with pt at this time, mitts removed

## 2024-03-26 NOTE — ED Notes (Addendum)
 This RN sees a note for Pt yesterday that stated:  "They informed this writer that [facility] requires 24 hours free from restraints and sitters before patient can return to their facility. "  EDP made aware sitter ended at about 0700 yesterday, per last RN who had her and this RN doesn't see any PRN meds given, mittens or physical restraints used. See new orders and resources set up to return to facility.

## 2024-03-26 NOTE — ED Provider Notes (Signed)
 5:30 AM  Pt cleared by psychiatry and able to go back to [facility] after being 24 hours free of restraints and sedation.  Patient has been calm and has not required any medications or a sitter since 7 AM yesterday.  No restraints physical or chemical needed.  Will discharge back to [facility].   Fia Hebert, Clover Dao, DO 03/26/24 [phone]

## 2024-03-26 NOTE — ED Notes (Signed)
 Vol/psych cleared/pending discharge.

## 2024-03-26 NOTE — Telephone Encounter (Signed)
 CT 04/10/2024 at 10:30am Left message with [facility] in [place] [phone] to schedule f/u with Dr.Smith since he did a consult in the ER.

## 2024-03-26 NOTE — ED Notes (Signed)
 [facility] called to transport patient to [facility] spoke with [facility] was not able to give a pick up time

## 2024-03-26 NOTE — ED Notes (Signed)
 Medical necessity and discharge paperwork given to secretary for [place] transportation request.

## 2024-03-28 NOTE — Telephone Encounter (Signed)
 Left message for [facility] in [place].

## 2024-04-02 NOTE — Telephone Encounter (Signed)
 Per [facility] patient has been admitted to Hospice for end of life care.

## 2024-04-10 ENCOUNTER — Ambulatory Visit

## 2024-04-27 DEATH — deceased
